# Patient Record
Sex: Male | Born: 1960 | Hispanic: Yes | Marital: Married | Smoking: Former smoker
Health system: Southern US, Community
[De-identification: ages and names within clinical notes are randomized; demographics above are authoritative.]

## PROBLEM LIST (undated history)

## (undated) DIAGNOSIS — E669 Obesity, unspecified: Secondary | ICD-10-CM

## (undated) DIAGNOSIS — F251 Schizoaffective disorder, depressive type: Secondary | ICD-10-CM

## (undated) DIAGNOSIS — E785 Hyperlipidemia, unspecified: Secondary | ICD-10-CM

## (undated) DIAGNOSIS — Z87442 Personal history of urinary calculi: Secondary | ICD-10-CM

## (undated) DIAGNOSIS — Z8619 Personal history of other infectious and parasitic diseases: Secondary | ICD-10-CM

## (undated) DIAGNOSIS — R51 Headache: Secondary | ICD-10-CM

## (undated) DIAGNOSIS — M545 Low back pain, unspecified: Secondary | ICD-10-CM

## (undated) DIAGNOSIS — G8929 Other chronic pain: Secondary | ICD-10-CM

## (undated) DIAGNOSIS — E039 Hypothyroidism, unspecified: Secondary | ICD-10-CM

## (undated) HISTORY — DX: Low back pain: M54.5

## (undated) HISTORY — PX: ABDOMINAL SURGERY: SHX537

## (undated) HISTORY — DX: Obesity, unspecified: E66.9

## (undated) HISTORY — DX: Hypothyroidism, unspecified: E03.9

## (undated) HISTORY — DX: Low back pain, unspecified: M54.50

## (undated) HISTORY — DX: Other chronic pain: G89.29

## (undated) HISTORY — DX: Hyperlipidemia, unspecified: E78.5

## (undated) HISTORY — DX: Personal history of other infectious and parasitic diseases: Z86.19

## (undated) HISTORY — DX: Personal history of urinary calculi: Z87.442

## (undated) HISTORY — DX: Headache: R51

## (undated) HISTORY — DX: Schizoaffective disorder, depressive type: F25.1

---

## 1999-09-27 HISTORY — PX: LUMBAR FUSION: SHX111

## 2009-08-10 LAB — HM COLONOSCOPY: HM Colonoscopy: NORMAL

## 2009-08-26 HISTORY — PX: COLONOSCOPY: SHX174

## 2009-09-01 ENCOUNTER — Encounter: Payer: Self-pay | Admitting: Internal Medicine

## 2010-04-07 ENCOUNTER — Telehealth: Payer: Self-pay | Admitting: Internal Medicine

## 2010-04-07 ENCOUNTER — Ambulatory Visit: Payer: Self-pay | Admitting: Internal Medicine

## 2010-04-07 DIAGNOSIS — G4730 Sleep apnea, unspecified: Secondary | ICD-10-CM | POA: Insufficient documentation

## 2010-04-07 DIAGNOSIS — E039 Hypothyroidism, unspecified: Secondary | ICD-10-CM

## 2010-04-07 DIAGNOSIS — R51 Headache: Secondary | ICD-10-CM

## 2010-04-07 DIAGNOSIS — R519 Headache, unspecified: Secondary | ICD-10-CM | POA: Insufficient documentation

## 2010-04-07 DIAGNOSIS — Z87442 Personal history of urinary calculi: Secondary | ICD-10-CM

## 2010-04-07 DIAGNOSIS — I10 Essential (primary) hypertension: Secondary | ICD-10-CM | POA: Insufficient documentation

## 2010-04-07 DIAGNOSIS — E785 Hyperlipidemia, unspecified: Secondary | ICD-10-CM

## 2010-04-08 ENCOUNTER — Encounter: Payer: Self-pay | Admitting: Internal Medicine

## 2010-04-14 ENCOUNTER — Encounter: Payer: Self-pay | Admitting: Internal Medicine

## 2010-04-14 LAB — CONVERTED CEMR LAB
BUN: 16 mg/dL (ref 6–23)
Bilirubin, Direct: 0.2 mg/dL (ref 0.0–0.3)
CO2: 24 meq/L (ref 19–32)
Chloride: 104 meq/L (ref 96–112)
Eosinophils Absolute: 0.2 10*3/uL (ref 0.0–0.7)
Glucose, Bld: 92 mg/dL (ref 70–99)
HCV Ab: NEGATIVE
Hep A Total Ab: NEGATIVE
Hepatitis B Surface Ag: NEGATIVE
Hgb A1c MFr Bld: 5.3 % (ref <5.7)
Indirect Bilirubin: 0.9 mg/dL (ref 0.0–0.9)
LDL Cholesterol: 142 mg/dL — ABNORMAL HIGH (ref 0–99)
Lymphs Abs: 1.3 10*3/uL (ref 0.7–4.0)
MCV: 85.2 fL (ref 78.0–100.0)
Monocytes Relative: 14 % — ABNORMAL HIGH (ref 3–12)
Neutro Abs: 1.4 10*3/uL — ABNORMAL LOW (ref 1.7–7.7)
Neutrophils Relative %: 42 % — ABNORMAL LOW (ref 43–77)
Potassium: 4.5 meq/L (ref 3.5–5.3)
RBC: 5.08 M/uL (ref 4.22–5.81)
Total Bilirubin: 1.1 mg/dL (ref 0.3–1.2)
VLDL: 27 mg/dL (ref 0–40)
WBC: 3.3 10*3/uL — ABNORMAL LOW (ref 4.0–10.5)

## 2010-04-15 ENCOUNTER — Telehealth: Payer: Self-pay | Admitting: Internal Medicine

## 2010-04-20 ENCOUNTER — Telehealth: Payer: Self-pay | Admitting: Internal Medicine

## 2010-04-27 ENCOUNTER — Encounter: Payer: Self-pay | Admitting: Internal Medicine

## 2010-04-28 ENCOUNTER — Encounter: Payer: Self-pay | Admitting: Internal Medicine

## 2010-05-11 ENCOUNTER — Encounter: Payer: Self-pay | Admitting: Internal Medicine

## 2010-06-03 ENCOUNTER — Ambulatory Visit: Payer: Self-pay | Admitting: Internal Medicine

## 2010-06-03 DIAGNOSIS — R7309 Other abnormal glucose: Secondary | ICD-10-CM

## 2010-06-14 ENCOUNTER — Telehealth: Payer: Self-pay | Admitting: Internal Medicine

## 2010-10-06 ENCOUNTER — Ambulatory Visit
Admission: RE | Admit: 2010-10-06 | Discharge: 2010-10-06 | Payer: Self-pay | Source: Home / Self Care | Attending: Internal Medicine | Admitting: Internal Medicine

## 2010-10-14 LAB — CONVERTED CEMR LAB
BUN: 19 mg/dL (ref 6–23)
CO2: 27 meq/L (ref 19–32)
CRP, High Sensitivity: 4.4 — ABNORMAL HIGH
Calcium: 9.5 mg/dL (ref 8.4–10.5)
Cholesterol: 169 mg/dL (ref 0–200)
Glucose, Bld: 86 mg/dL (ref 70–99)
Hgb A1c MFr Bld: 5.2 % (ref <5.7)
TSH: 0.296 microintl units/mL — ABNORMAL LOW (ref 0.350–4.500)
VLDL: 13 mg/dL (ref 0–40)

## 2010-10-15 ENCOUNTER — Telehealth: Payer: Self-pay | Admitting: Internal Medicine

## 2010-10-15 ENCOUNTER — Encounter: Payer: Self-pay | Admitting: Internal Medicine

## 2010-10-26 NOTE — Medication Information (Signed)
Summary: Prior Authorization Request for Januvia  Prior Authorization Request for Januvia   Imported By: Maryln Gottron 05/04/2010 12:17:27  _____________________________________________________________________  External Attachment:    Type:   Image     Comment:   External Document

## 2010-10-26 NOTE — Progress Notes (Signed)
Summary: FAXED RECORD REQUEST TODR Sherin Quarry   Phone Note Outgoing Call   Call placed by: [facility] Call placed to: dR aLEXANDER sWON  Summary of Call: FAXED REQUEST FOR RECORDS  Initial call taken by: Roselle Locus,  April 07, 2010 11:19 AM     Appended Document: FAXED RECORD REQUEST TODR Sherin Quarry  Records request Faxed to Dr Meda Klinefelter @ [phone]. Office number is [phone]. Records have also been requested from [facility] at [phone]

## 2010-10-26 NOTE — Letter (Signed)
Summary: Records Dated 1-15 thru 11-04-09/Alexander Ernestine Conrad MD  Records Dated 1-15 thru 11-04-09/Alexander Ernestine Conrad MD   Imported By: Lanelle Bal 04/15/2010 08:03:28  _____________________________________________________________________  External Attachment:    Type:   Image     Comment:   External Document

## 2010-10-26 NOTE — Progress Notes (Signed)
Summary: lab results  Phone Note Outgoing Call   Summary of Call: call pt - TSH normal.  continue same dose of thyroid medication.   hep c testing negative.  hep c viral load undetectable Initial call taken by: D. Thomos Lemons DO,  June 14, 2010 12:18 PM  Follow-up for Phone Call        Left detailed message on cell phone re: results and to call if any questions. Nicki Guadalajara Fergerson CMA Duncan Dull)  June 14, 2010 1:46 PM

## 2010-10-26 NOTE — Progress Notes (Signed)
Summary: Januvia Prior Auth--approved  Phone Note Other Incoming   Caller: Fax from [employer] of Call: Spoke to [place] @ [phone] and initiated prior authorization for Januvia. Case # [id number]. She will fax form. Spoke to pt's wife, Harriett Sine and notified her of the P.A. process.  She voices understanding.  Consuella Lose will fax form to [place]. Nicki Guadalajara Fergerson CMA Duncan Dull)  April 20, 2010 2:41 PM   Follow-up for Phone Call        Prior authorization form recieved for Januvia. Awaiting form completion from physician Follow-up by: Glendell Docker CMA,  April 21, 2010 2:40 PM  Additional Follow-up for Phone Call Additional follow up Details #1::        form faxed to [facility]. Awaiting approval status Additional Follow-up by: Glendell Docker CMA,  April 28, 2010 8:26 AM    Additional Follow-up for Phone Call Additional follow up Details #2::    Received authorization from [facility] for Januvia 100mg  (30/30) good until 04/27/2012.  Notified Lakhan at [employer] and pt. Nicki Guadalajara Fergerson CMA Duncan Dull)  April 28, 2010 1:31 PM

## 2010-10-26 NOTE — Letter (Signed)
Summary: Patient No Show/[facility]  Patient No Show/[facility]   Imported By: Lanelle Bal 05/19/2010 09:35:25  _____________________________________________________________________  External Attachment:    Type:   Image     Comment:   External Document

## 2010-10-26 NOTE — Progress Notes (Signed)
Summary: Thyroid  Status  Phone Note Outgoing Call   Summary of Call: call pt - has he been taking his thyroid medication regularly.  does he take with other meds or vitamins? Initial call taken by: D. Thomos Lemons DO,  April 15, 2010 12:57 PM  Follow-up for Phone Call        call placed to patient per Dr Artist Pais instructions. He states that he has not been taking his thyroid medication on  regular basis Follow-up by: Glendell Docker CMA,  April 15, 2010 1:48 PM  Additional Follow-up for Phone Call Additional follow up Details #1::        Inform pt TSH is elevated.  it is important for pt to take thyroid medication daily.   TSH needs to be repeated in 2 months.  Additional Follow-up by: D. Thomos Lemons DO,  April 15, 2010 4:14 PM    Additional Follow-up for Phone Call Additional follow up Details #2::    patient advised per Dr Artist Pais instructions. Future lab order entered for  05/2010 at [facility] Follow-up by: Glendell Docker CMA,  April 15, 2010 4:29 PM

## 2010-10-26 NOTE — Procedures (Signed)
Summary: Colonoscopy/[facility]  Colonoscopy/[facility]   Imported By: Lanelle Bal 04/15/2010 07:57:54  _____________________________________________________________________  External Attachment:    Type:   Image     Comment:   External Document

## 2010-10-26 NOTE — Assessment & Plan Note (Signed)
Summary: TO EST  /NEEDS MEDICATIONS   HBP/ THROID/HES   Vital Signs:  Patient profile:   50 year old male Height:      70 inches Weight:      297.75 pounds BMI:     42.88 O2 Sat:      96 % Temp:     98.1 degrees F oral Pulse rate:   61 / minute Pulse rhythm:   regular Resp:     20 per minute BP sitting:   118 / 74  (right arm) Cuff size:   large  Vitals Entered By: Glendell Docker CMA (April 07, 2010 9:32 AM) CC: Rm 2- New Patient  Is Patient Diabetic? No Comments insurance referral   Primary Care Provider:  D. Thomos Lemons DO  CC:  Rm 2- New Patient .  History of Present Illness: 50 y/o hispanic male to establish his daughter moved [place] due to job at [employer]  he has hx of htn, hyperlipidemia, borderline DM II , and possible hx of chronic hep C  significant wt gain after back surgery - prev weighted 210  gained 90 lbs after back surgery  diagnosed with Hepatitis (Jaundice) in 1996 - question cleared infection     Preventive Screening-Counseling & Management  Alcohol-Tobacco     Alcohol drinks/day: 0     Smoking Status: quit     Packs/Day: <0.25     Year Started: 1979     Year Quit: 1980  Caffeine-Diet-Exercise     Caffeine use/day: 2-3 beverages daily     Does Patient Exercise: yes     Times/week: 7  Allergies (verified): No Known Drug Allergies  Past History:  Past Medical History: Depression / Schizoprenia - last psychiatrist 2010   Wellbutrin, prozac, seroquel depression worse after back surgery Headache Hepatitis  Hyperlipidemia Hypertension Hypothyroidism Nephrolithiasis, hx of DM II - borderline  Past Surgical History: Inguinal herniorrhaphy Lumbar fusion  Family History: Family History Depression Family History High cholesterol Family History Hypertension Family History of Stroke F 1st degree relative <60   Social History: Married 25 years 2 daughters 21,24 Disabled due to back surgery Previously worked in Naval architect - Producer, television/film/video [place]:  [place] Packs/Day:  <0.25 Caffeine use/day:  2-3 beverages daily Does Patient Exercise:  yes  Review of Systems  The patient denies chest pain, syncope, prolonged cough, hemoptysis, abdominal pain, melena, hematochezia, severe indigestion/heartburn, and depression.         he has been dieting and exercising.  he is encouraged by wt loss   Physical Exam  General:  alert and overweight-appearing.   Head:  normocephalic and atraumatic.   Eyes:  pupils equal, pupils round, and pupils reactive to light.   Ears:  R ear normal and L ear normal.   Mouth:  pharynx pink and moist.   Neck:  No deformities, masses, or tenderness noted.no carotid bruits.   Lungs:  normal respiratory effort, normal breath sounds, no crackles, and no wheezes.   Heart:  normal rate, regular rhythm, and no gallop.   Abdomen:  soft, non-tender, and normal bowel sounds.   Extremities:  trace left pedal edema and trace right pedal edema.   Neurologic:  cranial nerves II-XII intact and gait normal.   Psych:  normally interactive, good eye contact, not anxious appearing, and not depressed appearing.     Impression & Recommendations:  Problem # 1:  HEPATITIS C (ICD-070.51) Pt with questionable  hx of Hep C.  he states he cleared infection.  reassess obtain copy of med records  Future Orders: T-CBC w/Diff [id number]) ... 04/12/2010 T-Hepatitis B Surface Antibody [phone]) ... 04/12/2010 T-Hepatitis B Surface Antigen [id number]) ... 04/12/2010 T-Hepatitis C Antibody ([id number]) ... 04/12/2010 T-Hepatitis A Antibody ([id number]) ... 04/12/2010  Problem # 2:  SNORING (ICD-786.09) pt with hx of loud snoring.  arrange sleep study Orders: Sleep Disorder Referral (Sleep Disorder)  Problem # 3:  HYPERTENSION (ICD-401.9)  His updated medication list for this problem includes:    Diovan 160 Mg Tabs (Valsartan) .Marland Kitchen... Take 1 tablet by mouth  two times a day  Future Orders: T-Basic Metabolic Panel [id number]) ... 04/12/2010  BP today: 118/74  Problem # 4:  HYPERLIPIDEMIA (ICD-272.4)  His updated medication list for this problem includes:    Crestor 40 Mg Tabs (Rosuvastatin calcium) .Marland Kitchen... Take 1 tab by mouth at bedtime  Future Orders: T-Hepatic Function [id number]) ... 04/12/2010 T-Lipid Profile [id number]) ... 04/12/2010  Problem # 5:  HYPOTHYROIDISM (ICD-244.9) monitor TFTs His updated medication list for this problem includes:    Synthroid 150 Mcg Tabs (Levothyroxine sodium) .Marland Kitchen... Take 1 tablet by mouth every morning  Future Orders: T-TSH ([id number]) ... 04/12/2010  Complete Medication List: 1)  Januvia 100 Mg Tabs (Sitagliptin phosphate) .... Take 1 tablet by mouth once a day 2)  Plavix 75 Mg Tabs (Clopidogrel bisulfate) .... Take 1 tablet by mouth once a day 3)  Diovan 160 Mg Tabs (Valsartan) .... Take 1 tablet by mouth two times a day 4)  Synthroid 150 Mcg Tabs (Levothyroxine sodium) .... Take 1 tablet by mouth every morning 5)  Calcitriol 0.5 Mcg Caps (Calcitriol) .... Take 1 capsule by mouth two times a day 6)  Crestor 40 Mg Tabs (Rosuvastatin calcium) .... Take 1 tab by mouth at bedtime 7)  Multivitamins Tabs (Multiple vitamin) .... Take 1 tablet by mouth once a day  Other Orders: Future Orders: T- Hemoglobin A1C ([id number]) ... 04/12/2010  Patient Instructions: 1)  Please schedule a follow-up appointment in 1 month. 2)  BMP prior to visit, ICD-9: 401.9 3)  Hepatic Panel prior to visit, ICD-9: 272.4 4)  Lipid Panel prior to visit, ICD-9: 272.4  5)  TSH prior to visit, ICD-9: 272.4 6)  CBC w/ Diff prior to visit, ICD-9: 070.51 7)  HbgA1C prior to visit, ICD-9: 790.29 8)  Hep B antibody,  Hep B antigen,  Hep C antibody,  Hep A antibody:  070.51 9)  Please return for lab work within 1 week. Prescriptions: CALCITRIOL 0.5 MCG CAPS (CALCITRIOL) Take 1 capsule by mouth two times a day  #30 x 1    Entered and Authorized by:   D. Thomos Lemons DO   Signed by:   D. Thomos Lemons DO on 04/07/2010   Method used:   Electronically to        [facility] [phone]* (retail)       [address]       [place], [place]  [address]       Ph: [phone]       Fax: [phone]   RxID:   [id number] SYNTHROID 150 MCG TABS (LEVOTHYROXINE SODIUM) Take 1 tablet by mouth every morning  #30 x 1   Entered and Authorized by:   D. Thomos Lemons DO   Signed by:   D. Thomos Lemons DO on 04/07/2010   Method used:   Electronically to  [facility] [phone]* (retail)       [address]       [place], [place]  [address]       Ph: [phone]       Fax: [phone]   RxID:   [id number] CRESTOR 40 MG TABS (ROSUVASTATIN CALCIUM) Take 1 tab by mouth at bedtime  #30 x 1   Entered and Authorized by:   D. Thomos Lemons DO   Signed by:   D. Thomos Lemons DO on 04/07/2010   Method used:   Electronically to        [facility] [phone]* (retail)       [address]       [place], [place]  [address]       Ph: [phone]       Fax: [phone]   RxID:   [id number] DIOVAN 160 MG TABS (VALSARTAN) Take 1 tablet by mouth two times a day  #60 x 1   Entered and Authorized by:   D. Thomos Lemons DO   Signed by:   D. Thomos Lemons DO on 04/07/2010   Method used:   Electronically to        [facility] [phone]* (retail)       [address]       [place], [place]  [address]       Ph: [phone]       Fax: [phone]   RxID:   [id number] PLAVIX 75 MG TABS (CLOPIDOGREL BISULFATE) Take 1 tablet by mouth once a day  #30 x 1   Entered and Authorized by:   D. Thomos Lemons DO   Signed by:   D. Thomos Lemons DO on 04/07/2010   Method used:   Electronically to        [facility] [phone]* (retail)       [address]       [place], [place]  [address]        Ph: [phone]       Fax: [phone]   RxID:   [id number] JANUVIA 100 MG TABS (SITAGLIPTIN PHOSPHATE) Take 1 tablet by mouth once a day  #30 x 1   Entered and Authorized by:   D. Thomos Lemons DO   Signed by:   D. Thomos Lemons DO on 04/07/2010   Method used:   Electronically to        [facility] [phone]* (retail)       [address]       [place], [place]  [address]       Ph: [phone]       Fax: [phone]   RxID:   [id number]    Preventive Care Screening  Colonoscopy:    Date:  08/10/2009    Results:  normal    Preventive Care Screening  Colonoscopy:    Date:  08/10/2009    Results:  normal    Current Allergies (reviewed today): No known allergies

## 2010-10-26 NOTE — Medication Information (Signed)
Summary: Approval for Januvia/[employer]  Approval for Januvia/[employer]   Imported By: Lanelle Bal 05/21/2010 11:01:34  _____________________________________________________________________  External Attachment:    Type:   Image     Comment:   External Document

## 2010-10-26 NOTE — Assessment & Plan Note (Signed)
Summary: 1 month follow up/mhf rsch from bmp list/dt   Vital Signs:  Patient profile:   50 year old male Height:      70 inches Weight:      274.25 pounds BMI:     39.49 O2 Sat:      97 % on Room air Temp:     97.9 degrees F oral Pulse rate:   61 / minute Pulse rhythm:   regular Resp:     18 per minute BP sitting:   110 / 80  (right arm) Cuff size:   large  Vitals Entered By: Glendell Docker CMA (June 03, 2010 7:54 AM)  O2 Flow:  Room air  Contraindications/Deferment of Procedures/Staging:    Test/Procedure: FLU VAX    Reason for deferment: patient declined  CC: 1 month follow up  Is Patient Diabetic? No Pain Assessment Patient in pain? no      Comments no concerns   Primary Care Provider:  DThomos Lemons DO  CC:  1 month follow up .  History of Present Illness: 50 y/o hispanic male for f/u pt doing very well he is exercising regularly at the gym and following low cal diet he lost additional 25-30 lbs since prev visit he stopped taking all meds with exception of thyroid medication  he denies chest pain or SOB with exercise    Preventive Screening-Counseling & Management  Alcohol-Tobacco     Smoking Status: quit  Allergies (verified): No Known Drug Allergies  Past History:  Past Medical History: Depression / Schizoprenia - last psychiatrist 2010   Wellbutrin, prozac, seroquel depression worse after back surgery Headache Hepatitis C Hyperlipidemia Hypertension Hypothyroidism Nephrolithiasis, hx of DM II - borderline  Past Surgical History: Inguinal herniorrhaphy Lumbar fusion    Family History: Family History Depression Family History High cholesterol Family History Hypertension Family History of Stroke F 1st degree relative <60     Social History: Married 25 years 2 daughters 21,24 Disabled due to back surgery 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   Head:  normocephalic and atraumatic.   Lungs:  normal respiratory effort, normal breath sounds, no crackles, and no wheezes.   Heart:  normal rate, regular rhythm, and no gallop.   Extremities:  trace left pedal edema and trace right pedal edema.   Psych:  normally interactive, good eye contact, not anxious appearing, and not depressed appearing.     Impression & Recommendations:  Problem # 1:  HYPOTHYROIDISM (ICD-244.9) repeat TSH today.  pt reports missing 20 days of thyroid medication while in the process of moving to [place]  His updated medication list for this problem includes:    Synthroid 150 Mcg Tabs (Levothyroxine sodium) .Marland Kitchen... Take 1 tablet by mouth every morning  Orders: T-TSH ([id number])  Labs Reviewed: TSH: 18.415 (04/14/2010)    HgBA1c: 5.3 (04/14/2010) Chol: 202 (04/14/2010)   HDL: 33 (04/14/2010)   LDL: 142 (04/14/2010)   TG: 134 (04/14/2010)  Problem # 2:  HEPATITIS C (ICD-070.51) Hep C antibody negative.  LFTs normal. Pt reports prev diagnosis of chronic Hep C.  check viral load Orders: T- * Misc. Laboratory test [phone])  Problem # 3:  HYPERLIPIDEMIA (ICD-272.4) pt stopped taking crestor The following medications were removed from the medication list:    Crestor 40 Mg Tabs (Rosuvastatin calcium) .Marland Kitchen... Take 1 tab by mouth at bedtime  Problem #  4:  HYPERTENSION (ICD-401.9) BP normal w/o diovan after significant wt loss. continue to monitor  The following medications were removed from the medication list:    Diovan 160 Mg Tabs (Valsartan) .Marland Kitchen... Take 1 tablet by mouth two times a day  BP today: 110/80 Prior BP: 118/74 (04/07/2010)  Labs Reviewed: K+: 4.5 (04/14/2010) Creat: : 1.33 (04/14/2010)   Chol: 202 (04/14/2010)   HDL: 33 (04/14/2010)   LDL: 142 (04/14/2010)   TG: 134 (04/14/2010)  Problem # 5:  DIABETES MELLITUS, TYPE II, BORDERLINE (ICD-790.29) Assessment: Improved recent A1c 5.3.  Pt very  motivated re:  lifestyle changes.  continue diet and exercise  The following medications were removed from the medication list:    Januvia 100 Mg Tabs (Sitagliptin phosphate) .Marland Kitchen... Take 1 tablet by mouth once a day  Labs Reviewed: Creat: 1.33 (04/14/2010)     Complete Medication List: 1)  Synthroid 150 Mcg Tabs (Levothyroxine sodium) .... Take 1 tablet by mouth every morning 2)  Multivitamins Tabs (Multiple vitamin) .... Take 1 tablet by mouth once a day  Patient Instructions: 1)  Please schedule a follow-up appointment in 4 months.  Current Allergies (reviewed today): No known allergies    Immunization History:  Influenza Immunization History:    Influenza:  declined (06/03/2010)

## 2010-10-28 NOTE — Progress Notes (Signed)
Summary: Lab Results  Phone Note Outgoing Call   Summary of Call: call pt - blood work shows pt taking too much thyroid hormone.  I sugget with decrease dose.  see rx   repeat TSH in 2 months Initial call taken by: D. Thomos Lemons DO,  October 15, 2010 9:06 AM  Follow-up for Phone Call        call placed to patient at [phone], he has been informed per Dr Artist Pais instructions. Lab order entered for [facility] for March 2012 Follow-up by: Glendell Docker CMA,  October 15, 2010 9:26 AM    New/Updated Medications: SYNTHROID 137 MCG TABS (LEVOTHYROXINE SODIUM) one by mouth once daily Prescriptions: SYNTHROID 137 MCG TABS (LEVOTHYROXINE SODIUM) one by mouth once daily  #30 x 2   Entered and Authorized by:   D. Thomos Lemons DO   Signed by:   D. Thomos Lemons DO on 10/15/2010   Method used:   Electronically to        [facility] [phone]* (retail)       [address]       [place], [place]  [address]       Ph: [phone]       Fax: [phone]   RxID:   [id number]

## 2010-10-28 NOTE — Letter (Signed)
   [place] at [facility] [address] [place], [place]  [address]  [place] Phone: [phone]      October 15, 2010   Franklin Shannon [address]  [place], [place] [address]  RE:  LAB RESULTS  Dear  Franklin Shannon,  The following is an interpretation of your most recent lab tests.  Please take note of any instructions provided or changes to medications that have resulted from your lab work.  ELECTROLYTES:  Good - no changes needed  KIDNEY FUNCTION TESTS:  Good - no changes needed  LIPID PANEL:  Good - no changes needed Triglyceride: 67   Cholesterol: 169   LDL: 114   HDL: 42   Chol/HDL%:  4.0 Ratio   DIABETIC STUDIES:  Excellent - no changes needed Blood Glucose: 86   HgbA1C: 5.2          Sincerely Yours,    Dr. Thomos Lemons  Appended Document:  Mailed.

## 2010-10-28 NOTE — Assessment & Plan Note (Signed)
Summary: 4 MONTH FOLLOW UP/MHF   Vital Signs:  Patient profile:   50 year old male Height:      70 inches Weight:      245.25 pounds BMI:     35.32 O2 Sat:      100 % on Room air Temp:     97.7 degrees F oral Pulse rate:   64 / minute Resp:     18 per minute BP sitting:   106 / 72  (right arm) Cuff size:   large  Vitals Entered By: Glendell Docker CMA (October 06, 2010 8:08 AM)  O2 Flow:  Room air CC: 4 Month Follow up Is Patient Diabetic? No Pain Assessment Patient in pain? no        Primary Care Provider:  Dondra Spry DO  CC:  4 Month Follow up.  History of Present Illness: 50 y/o male for f/u making good progress with weight loss initially wt loss easier - now losing 1-2 lbs per week pt following cal restricted diet and exercising regularly  still getting occ headaches     Preventive Screening-Counseling & Management  Alcohol-Tobacco     Smoking Status: quit  Allergies (verified): No Known Drug Allergies  Past History:  Past Medical History: Depression / Schizoprenia - last psychiatrist 2010   Wellbutrin, prozac, seroquel depression worse after back surgery Headache Hyperlipidemia Hypertension Hypothyroidism Nephrolithiasis, hx of Obesity / DM II - borderline  Past Surgical History: Inguinal herniorrhaphy Lumbar fusion     Social History: Married 25 years 2 daughters 21,24 Disabled due to back surgery 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   Head:  normocephalic and atraumatic.   Mouth:  pharynx pink and moist.   Lungs:  normal respiratory effort, normal breath sounds, no crackles, and no wheezes.   Heart:  normal rate, regular rhythm, and no gallop.   Extremities:  trace left pedal edema and trace right pedal edema.   Psych:  normally interactive, good eye contact, not anxious appearing, and not depressed  appearing.     Impression & Recommendations:  Problem # 1:  HYPOTHYROIDISM (ICD-244.9) monitor TFTs  His updated medication list for this problem includes:    Synthroid 150 Mcg Tabs (Levothyroxine sodium) .Marland Kitchen... Take 1 tablet by mouth every morning  Orders: T-TSH ([id number])  Problem # 2:  HYPERTENSION (ICD-401.9) stable off meds.  continue to monitor  Orders: T-Basic Metabolic Panel [id number])  BP today: 106/72 Prior BP: 110/80 (06/03/2010)  Labs Reviewed: K+: 4.5 (04/14/2010) Creat: : 1.33 (04/14/2010)   Chol: 202 (04/14/2010)   HDL: 33 (04/14/2010)   LDL: 142 (04/14/2010)   TG: 134 (04/14/2010)  Problem # 3:  HYPERLIPIDEMIA (ICD-272.4) pt is not fasting today  Orders: T-Lipid Profile ([id number]) CRP, high sensitivity-[facility] ([id number])  Problem # 4:  HEADACHE (ICD-784.0) pt gets periodic headaches wife notes symptoms brought on by stress ( arguing with his neighbor) pt reports prev w/u including MRI of brain negative continue use ibuprofen as needed.  use short term Patient advised to follow up if headache pattern or severity changes  Complete Medication List: 1)  Synthroid 150 Mcg Tabs (Levothyroxine sodium) .... Take 1 tablet by mouth every morning  Other Orders: T- Hemoglobin A1C ([id number])  Patient Instructions: 1)  Please schedule a follow-up appointment in 6 months.   Orders Added: 1)  T-TSH [[id number]] 2)  T- Hemoglobin  A1C [[id number]] 3)  T-Basic Metabolic Panel [[id number]] 4)  T-Lipid Profile [[id number]] 5)  CRP, high sensitivity-[facility] [[id number]] 6)  Est. Patient Level III [[address]]    Current Allergies (reviewed today): No known allergies

## 2010-12-10 ENCOUNTER — Ambulatory Visit (INDEPENDENT_AMBULATORY_CARE_PROVIDER_SITE_OTHER): Payer: Medicare HMO | Admitting: Internal Medicine

## 2010-12-10 ENCOUNTER — Encounter: Payer: Self-pay | Admitting: Internal Medicine

## 2010-12-10 DIAGNOSIS — R7309 Other abnormal glucose: Secondary | ICD-10-CM

## 2010-12-10 DIAGNOSIS — I10 Essential (primary) hypertension: Secondary | ICD-10-CM

## 2010-12-28 NOTE — Assessment & Plan Note (Signed)
Summary: FOLLOW UP/HEA   Vital Signs:  Patient profile:   50 year old male Height:      70 inches Weight:      234 pounds BMI:     33.70 O2 Sat:      99 % on Room air Temp:     97.4 degrees F oral Pulse rate:   59 / minute Resp:     18 per minute BP sitting:   104 / 70  (right arm) Cuff size:   large  Vitals Entered By: Glendell Docker CMA (December 10, 2010 2:04 PM)  O2 Flow:  Room air CC: follow-up visit, Type 2 diabetes mellitus follow-up Is Patient Diabetic? No Pain Assessment Patient in pain? no      Comments no concerns, refill on synthroid   Primary Care Provider:  Dondra Spry DO  CC:  follow-up visit and Type 2 diabetes mellitus follow-up.  History of Present Illness:  Borderline Type 2 Diabetes Mellitus Follow-Up      This is a 50 year old man who presents for Type 2 diabetes mellitus follow-up.  The patient denies weight gain.  The patient denies the following symptoms: chest pain.  Since the last visit the patient reports good dietary compliance and exercising regularly.  pt continues to lose wt but at slower rate  hypothyroidism - stable  Preventive Screening-Counseling & Management  Alcohol-Tobacco     Smoking Status: quit  Allergies (verified): No Known Drug Allergies  Past History:  Past Medical History: Depression / Schizoprenia - last psychiatrist 2010   Wellbutrin, prozac, seroquel depression worse after back surgery Headache Hyperlipidemia Hypertension Hypothyroidism  Nephrolithiasis, hx of Obesity / DM II - borderline  Family History: Family History Depression Family History High cholesterol Family History Hypertension Family History of Stroke F 1st degree relative <60      Social History: Married 25 years 2 daughters 21,24 Disabled due to back surgery 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     Neck:  No deformities, masses, or tenderness noted.no carotid bruits.   Lungs:  normal respiratory effort, normal breath sounds, no crackles, and no wheezes.   Heart:  normal rate, regular rhythm, and no gallop.   Extremities:  trace left pedal edema and trace right pedal edema.     Impression & Recommendations:  Problem # 1:  DIABETES MELLITUS, TYPE II, BORDERLINE (ICD-790.29) Assessment Improved  Labs Reviewed: Creat: 1.13 (10/14/2010)     Problem # 2:  HYPOTHYROIDISM (ICD-244.9) Assessment: Unchanged slightly overtreated.  reduce dose to 125 mcg His updated medication list for this problem includes:    Levothyroxine Sodium 125 Mcg Tabs (Levothyroxine sodium) ..... One by mouth once daily  Labs Reviewed: TSH: 0.312 (12/08/2010)    HgBA1c: 5.2 (10/14/2010) Chol: 169 (10/14/2010)   HDL: 42 (10/14/2010)   LDL: 114 (10/14/2010)   TG: 67 (10/14/2010)  Complete Medication List: 1)  Levothyroxine Sodium 125 Mcg Tabs (Levothyroxine sodium) .... One by mouth once daily  Patient Instructions: 1)  Please schedule a follow-up appointment in 6 months (30 MINS) 2)  TSH prior to visit, ICD-9:  244.9 - 2 months Prescriptions: LEVOTHYROXINE SODIUM 125 MCG TABS (LEVOTHYROXINE SODIUM) one by mouth once daily  #90 x 1   Entered and Authorized by:   D. Thomos Lemons DO   Signed by:   D. Thomos Lemons DO on  12/10/2010   Method used:   Electronically to        [facility]. [phone]* (retail)       [address]       [place], [place]  [address]       Ph: [phone]       Fax: [phone]   RxID:   [id number]    Orders Added: 1)  Est. Patient Level III [44010]      Current Allergies (reviewed today): No known allergies

## 2011-02-08 ENCOUNTER — Encounter: Payer: Self-pay | Admitting: Internal Medicine

## 2011-02-11 ENCOUNTER — Encounter: Payer: Self-pay | Admitting: Internal Medicine

## 2011-02-11 ENCOUNTER — Ambulatory Visit (INDEPENDENT_AMBULATORY_CARE_PROVIDER_SITE_OTHER): Payer: Medicare HMO | Admitting: Internal Medicine

## 2011-02-11 VITALS — BP 100/70 | HR 64 | Temp 97.8°F | Resp 18 | Ht 70.0 in | Wt 218.0 lb

## 2011-02-11 DIAGNOSIS — Z0289 Encounter for other administrative examinations: Secondary | ICD-10-CM

## 2011-03-09 ENCOUNTER — Ambulatory Visit: Payer: Self-pay | Admitting: Internal Medicine

## 2011-04-23 NOTE — Progress Notes (Signed)
  Subjective:    Patient ID: Franklin Shannon, male    DOB: 1961-02-17, 50 y.o.   MRN: [id number]  HPI    Review of Systems     Objective:   Physical Exam        Assessment & Plan:   No problem-specific assessment & plan notes found for this encounter.

## 2011-06-14 ENCOUNTER — Other Ambulatory Visit: Payer: Self-pay | Admitting: *Deleted

## 2011-06-14 MED ORDER — LEVOTHYROXINE SODIUM 125 MCG PO CAPS: 125.0000 ug | ORAL_CAPSULE | Freq: Every day | ORAL | Status: DC

## 2011-06-14 NOTE — Telephone Encounter (Signed)
Call received from pharmacy for refill on Levothyroxine 125 mcg. Per pharmacy message, the patient is requesting a 90 day supply

## 2011-06-14 NOTE — Telephone Encounter (Signed)
rx sent in electronically 

## 2011-09-27 DIAGNOSIS — Z87442 Personal history of urinary calculi: Secondary | ICD-10-CM

## 2011-09-27 HISTORY — DX: Personal history of urinary calculi: Z87.442

## 2012-10-04 ENCOUNTER — Ambulatory Visit (INDEPENDENT_AMBULATORY_CARE_PROVIDER_SITE_OTHER): Payer: Medicare Other | Admitting: Family Medicine

## 2012-10-04 ENCOUNTER — Encounter: Payer: Self-pay | Admitting: Family Medicine

## 2012-10-04 VITALS — BP 122/86 | HR 52 | Temp 98.5°F | Ht 68.5 in | Wt 251.8 lb

## 2012-10-04 DIAGNOSIS — J069 Acute upper respiratory infection, unspecified: Secondary | ICD-10-CM

## 2012-10-04 DIAGNOSIS — I10 Essential (primary) hypertension: Secondary | ICD-10-CM

## 2012-10-04 DIAGNOSIS — G8929 Other chronic pain: Secondary | ICD-10-CM | POA: Insufficient documentation

## 2012-10-04 DIAGNOSIS — R0989 Other specified symptoms and signs involving the circulatory and respiratory systems: Secondary | ICD-10-CM

## 2012-10-04 DIAGNOSIS — M545 Low back pain, unspecified: Secondary | ICD-10-CM

## 2012-10-04 DIAGNOSIS — Z87442 Personal history of urinary calculi: Secondary | ICD-10-CM

## 2012-10-04 DIAGNOSIS — R0609 Other forms of dyspnea: Secondary | ICD-10-CM

## 2012-10-04 DIAGNOSIS — R51 Headache: Secondary | ICD-10-CM

## 2012-10-04 DIAGNOSIS — E039 Hypothyroidism, unspecified: Secondary | ICD-10-CM

## 2012-10-04 DIAGNOSIS — Z125 Encounter for screening for malignant neoplasm of prostate: Secondary | ICD-10-CM

## 2012-10-04 DIAGNOSIS — Z23 Encounter for immunization: Secondary | ICD-10-CM

## 2012-10-04 DIAGNOSIS — E785 Hyperlipidemia, unspecified: Secondary | ICD-10-CM

## 2012-10-04 DIAGNOSIS — R7309 Other abnormal glucose: Secondary | ICD-10-CM

## 2012-10-04 DIAGNOSIS — Z8619 Personal history of other infectious and parasitic diseases: Secondary | ICD-10-CM

## 2012-10-04 MED ORDER — ALBUTEROL SULFATE HFA 108 (90 BASE) MCG/ACT IN AERS: 2.0000 | INHALATION_SPRAY | Freq: Four times a day (QID) | RESPIRATORY_TRACT | Status: DC | PRN

## 2012-10-04 MED ORDER — SYNTHROID 125 MCG PO TABS: 125.0000 ug | ORAL_TABLET | Freq: Every day | ORAL | Status: DC

## 2012-10-04 MED ORDER — LEVOTHYROXINE SODIUM 125 MCG PO CAPS: 125.0000 ug | ORAL_CAPSULE | Freq: Every day | ORAL | Status: DC

## 2012-10-04 NOTE — Patient Instructions (Addendum)
empieze synthroid diarios.  regrese en 4-6 semanas para revisar sangre y ahi podremos hacer calibracion si se necesita. regresar en 4-6 semanas para laboratorio de Publishing rights manager. regrese 1 semana despues para examen fisico. vaccuna contra flu hoy. creo que tiene infeccion viral que esta causando reactividad [employer] - use albuterol para ayudar a respirar profundo - si tiene mas fiebre, tos que [place] en vez de [place], o algun otra preocupacion, dejenos saber. Gusto conocerlo hoy, llame a clinica con preguntas.

## 2012-10-04 NOTE — Assessment & Plan Note (Signed)
Stable off meds. Continue to monitor.  May be able to take of problem list.

## 2012-10-04 NOTE — Progress Notes (Signed)
Subjective:    Patient ID: Franklin Shannon, male    DOB: 1961-06-20, 52 y.o.   MRN: [id number]  HPI CC: transfer of care  Prior pt of Dr. Artist Pais, would like to [facility] at [facility].  Moved from [place] 2011.  Cold sxs - 8-9 d h/o night time cough, chest congestion, ST, fever, HA  Has taken vit C so far, robitussin. Wife and daughter also sick. No h/o asthma.  No smokers at home.  Chronic constipation - since thyroid disease.  Hypothyroid - dx 1998.  Has been on synthroid long term.  Borderline diabetes - trying to exercise, lose weight.  Goes to gym daily.  Eats healthy - fruits/vegetables daily.  However has gained 33 lbs in last 1.5 yrs.  Doesn't check sugars. Lab Results  Component Value Date   HGBA1C 5.2 10/14/2010   Preventative:  No recent CPE Flu shot -  Tetanus - thinks 20+ yrs ago. Colonoscopy - 2010.  H/o blood in stool, told normal study. Prostate screening - no h/o screening.  H/o prostatitis in past.  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  Past histories reviewed and updated if relevant as below. Patient Active Problem List  Diagnosis  . HYPOTHYROIDISM  . HYPERLIPIDEMIA  . HYPERTENSION  . HEADACHE  . SNORING  . DIABETES MELLITUS, TYPE II, BORDERLINE  . NEPHROLITHIASIS, HX OF   Past Medical History  Diagnosis Date  . Schizoaffective disorder, depressive type     last psychiatrist 2010- worsened after back surgery, Wellbutrin, Prozac, seroquel  . Headache   . Hyperlipidemia   . Hypertension   . Hypothyroidism   . History of nephrolithiasis 2013  . Obesity   . Borderline diabetes     Type II, ?prediabetes  . Chronic lower back pain   . History of chicken  pox   . Hepatitis C     resolved   Past Surgical History  Procedure Date  . Lumbar fusion 2001    with rods  . Abdominal surgery     hernia?   History  Substance Use Topics  . Smoking status: Former Games developer  . Smokeless tobacco: Never Used     Comment: quit in 1980's  . Alcohol Use: No   Family History  Problem Relation Age of Onset  . Diabetes Daughter   . Diabetes Brother   . Cancer Father     throat  . Cancer Paternal Grandfather     throat  . Cancer Paternal Aunt     stomach x2  . Cancer Brother     eye  . Hyperlipidemia Mother   . Hypertension Mother   . CAD Mother     MI x2   No Known Allergies Current Outpatient Prescriptions on File Prior to Visit  Medication Sig Dispense Refill  . Levothyroxine Sodium 125 MCG CAPS Take 1 capsule (125 mcg total) by mouth daily.  30 capsule  3     Review of Systems No vision changes, abd pain, nausea/vomiting, diarrhea, chest pain, SOB.    Objective:   Physical Exam  Nursing note and vitals reviewed. Constitutional: He is oriented to person, place, and time. He appears well-developed and well-nourished. No distress.  HENT:  Head: Normocephalic and atraumatic.  Right Ear: Hearing, tympanic membrane, external ear and ear canal normal.  Left Ear: Hearing, tympanic membrane, external ear and ear canal normal.  Nose: Nose normal. No mucosal edema or rhinorrhea. Right sinus exhibits no maxillary sinus tenderness and no frontal sinus tenderness. Left sinus exhibits no maxillary sinus tenderness and no frontal sinus tenderness.  Mouth/Throat: Uvula is midline, oropharynx is clear and moist and mucous membranes are normal. No oropharyngeal exudate, posterior oropharyngeal edema, posterior oropharyngeal erythema or tonsillar abscesses.  Eyes: Conjunctivae normal and EOM are normal. Pupils are equal, round, and reactive to light. No scleral icterus.  Neck: Normal range of motion. Neck supple. Carotid bruit is not present. No  thyromegaly present.  Cardiovascular: Normal rate, regular rhythm, normal heart sounds and intact distal pulses.   No murmur heard. Pulses:      Radial pulses are 2+ on the right side, and 2+ on the left side.  Pulmonary/Chest: Effort normal and breath sounds normal. No respiratory distress. He has no wheezes. He has no rales.       Slight R rhonchi/wheeze  Abdominal: Soft. Bowel sounds are normal. He exhibits no distension and no mass. There is no tenderness. There is no rebound and no guarding.  Musculoskeletal: Normal range of motion. He exhibits no edema.  Lymphadenopathy:    He has no cervical adenopathy.  Neurological: He is alert and oriented to person, place, and time.       CN grossly intact, station and gait intact  Skin: Skin is warm and dry. No rash noted.  Psychiatric: He has a normal mood and affect. His behavior is normal. Judgment and thought content normal.       Assessment & Plan:

## 2012-10-04 NOTE — Assessment & Plan Note (Signed)
Has not taken consistently for last 1.5 yrs.  Anticipate some of his current sxs stemming from this. restart synthroid daily = sent to pharmacy and will recheck in 4-6 wks. Discussed brand vs generic with pt - desires brand name only.

## 2012-10-04 NOTE — Assessment & Plan Note (Signed)
unclear dx as last A1c 5.2.  Recheck when returns for blood work.

## 2012-10-04 NOTE — Assessment & Plan Note (Signed)
On disability for this.  ?

## 2012-10-04 NOTE — Assessment & Plan Note (Signed)
Discussed anticipated course of viral illness. Some reactivity on exam - wheeze/rhonchi on R lower lobe. Treat with albuterol - discussed common side effects of B-blockers. Pt states robitussin helping him sleep. Discussed red flags to return with concern for bacterial superinfection.

## 2012-10-04 NOTE — Assessment & Plan Note (Signed)
Check when returns fasting. 

## 2012-10-05 ENCOUNTER — Telehealth: Payer: Self-pay

## 2012-10-05 NOTE — Telephone Encounter (Signed)
Message left advising pharmacy to use name brand synthroid 125 mcg.

## 2012-10-05 NOTE — Telephone Encounter (Signed)
I want name brand synthroid daily.  Thanks.

## 2012-10-05 NOTE — Telephone Encounter (Signed)
[facility] called to clarify received synthroid name brand 125 mcg prescription and also received Tirosint 125 mcg but in capsule form. Which do you want pt to have.

## 2012-11-28 ENCOUNTER — Other Ambulatory Visit (INDEPENDENT_AMBULATORY_CARE_PROVIDER_SITE_OTHER): Payer: Medicare Other

## 2012-11-28 DIAGNOSIS — Z125 Encounter for screening for malignant neoplasm of prostate: Secondary | ICD-10-CM

## 2012-11-28 LAB — COMPREHENSIVE METABOLIC PANEL
ALT: 28 U/L (ref 0–53)
AST: 25 U/L (ref 0–37)
Albumin: 4 g/dL (ref 3.5–5.2)
Alkaline Phosphatase: 53 U/L (ref 39–117)
BUN: 21 mg/dL (ref 6–23)
Calcium: 9.9 mg/dL (ref 8.4–10.5)
Chloride: 105 mEq/L (ref 96–112)
Potassium: 5.2 mEq/L — ABNORMAL HIGH (ref 3.5–5.1)
Sodium: 138 mEq/L (ref 135–145)
Total Protein: 7.4 g/dL (ref 6.0–8.3)

## 2012-11-28 LAB — LIPID PANEL
Cholesterol: 196 mg/dL (ref 0–200)
LDL Cholesterol: 138 mg/dL — ABNORMAL HIGH (ref 0–99)
Total CHOL/HDL Ratio: 5
VLDL: 19.4 mg/dL (ref 0.0–40.0)

## 2012-12-03 ENCOUNTER — Encounter: Payer: Self-pay | Admitting: Family Medicine

## 2012-12-03 ENCOUNTER — Ambulatory Visit (INDEPENDENT_AMBULATORY_CARE_PROVIDER_SITE_OTHER): Payer: Medicare Other | Admitting: Family Medicine

## 2012-12-03 VITALS — BP 118/76 | HR 76 | Temp 98.1°F | Ht 70.0 in | Wt 256.0 lb

## 2012-12-03 DIAGNOSIS — E785 Hyperlipidemia, unspecified: Secondary | ICD-10-CM

## 2012-12-03 MED ORDER — SYNTHROID 125 MCG PO TABS: 125.0000 ug | ORAL_TABLET | Freq: Every day | ORAL | Status: DC

## 2012-12-03 NOTE — Assessment & Plan Note (Signed)
No evidence of this - removed from list.

## 2012-12-03 NOTE — Assessment & Plan Note (Signed)
Mild, off meds. 

## 2012-12-03 NOTE — Assessment & Plan Note (Signed)
Chronic. Stable on current dose - continue.

## 2012-12-03 NOTE — Progress Notes (Signed)
Subjective:    Patient ID: Franklin Shannon, male    DOB: [birth year], 52 y.o.   MRN: [id number]  HPI CC: CPE  Here for annual exam.  No concerns today. No recent vision exam - rec schedule this.  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  moved here from [place], [place]  Activity: gym daily - cardio Diet: good water, fruits/vegetables dialy  Preventative:  Flu shot - done last visit. 2014. Tetanus - Tdap today  Colonoscopy - 2010. H/o blood in stool.  1 polyp - no results of pathology available.  Requested today. Prostate screening - no h/o screening. H/o prostatitis in past. Declines screening today.  Seat belt use discussed. Sunscreen use discussed.  Wt Readings from Last 3 Encounters:  12/03/12 256 lb (116.121 kg)  10/04/12 251 lb 12 oz (114.193 kg)  02/11/11 218 lb (98.884 kg)   Medications and allergies reviewed and updated in chart.  Past histories reviewed and updated if relevant as below. Patient Active Problem List  Diagnosis  . HYPOTHYROIDISM  . HYPERLIPIDEMIA  . HYPERTENSION  . Headache  . SNORING  . DIABETES MELLITUS, TYPE II, BORDERLINE  . NEPHROLITHIASIS, HX OF  . Chronic lower back pain  . History of hepatitis C  . Viral URI with cough   Past Medical History  Diagnosis Date  . Schizoaffective disorder, depressive type     last psychiatrist 2010- worsened after back surgery, Wellbutrin, Prozac, seroquel  . Headache   . Dyslipidemia   . Hypothyroidism   . History of nephrolithiasis 2013  . Obesity   . Chronic lower back pain   . History of chicken pox   . History of hepatitis C     resolved per pt   Past Surgical History  Procedure Laterality Date  . Lumbar fusion  2001    with rods  . Abdominal surgery      hernia?  . Colonoscopy  12 7 10     hemorrhoid, colon polyp, ?colitis   History   Substance Use Topics  . Smoking status: Former Games developer  . Smokeless tobacco: Never Used     Comment: quit in 1980's  . Alcohol Use: No   Family History  Problem Relation Age of Onset  . Diabetes Daughter   . Diabetes Brother   . Cancer Father     throat  . Cancer Paternal Grandfather     throat  . Cancer Paternal Aunt     stomach x2  . Cancer Brother     eye  . Hyperlipidemia Mother   . Hypertension Mother   . CAD Mother     MI x2   No Known Allergies Current Outpatient Prescriptions on File Prior to Visit  Medication Sig Dispense Refill  . albuterol (PROVENTIL HFA;VENTOLIN HFA) 108 (90 BASE) MCG/ACT inhaler Inhale 2 puffs into the lungs every 6 (six) hours as needed for wheezing.  1 Inhaler  1  . SYNTHROID 125 MCG tablet Take 1 tablet (125 mcg total) by mouth daily.  30 tablet  3   No current facility-administered medications on file prior to visit.    Review of Systems  Constitutional: Negative for fever, chills, activity change, appetite change, fatigue and unexpected weight change.  HENT: Negative for hearing loss and neck pain.   Eyes: Positive for visual disturbance (blurry).  Respiratory: Negative for cough,  chest tightness, shortness of breath and wheezing.   Cardiovascular: Negative for chest pain, palpitations and leg swelling.  Gastrointestinal: Positive for constipation. Negative for nausea, vomiting, abdominal pain, diarrhea, blood in stool and abdominal distention.  Genitourinary: Negative for hematuria and difficulty urinating.  Musculoskeletal: Negative for myalgias and arthralgias.  Skin: Negative for rash.  Neurological: Positive for headaches. Negative for dizziness, seizures and syncope.  Hematological: Does not bruise/bleed easily.  Psychiatric/Behavioral: Negative for dysphoric mood. The patient is not nervous/anxious.        Objective:   Physical Exam  Nursing note and vitals reviewed. Constitutional: He is oriented to person, place, and time.  He appears well-developed and well-nourished. No distress.  HENT:  Head: Normocephalic and atraumatic.  Right Ear: Hearing, tympanic membrane, external ear and ear canal normal.  Left Ear: Hearing, tympanic membrane, external ear and ear canal normal.  Nose: Nose normal.  Mouth/Throat: Oropharynx is clear and moist. No oropharyngeal exudate.  Eyes: Conjunctivae and EOM are normal. Pupils are equal, round, and reactive to light. No scleral icterus.  Cataracts bilaterally  Neck: Normal range of motion. Neck supple. Carotid bruit is not present. No thyromegaly present.  Cardiovascular: Normal rate, regular rhythm, normal heart sounds and intact distal pulses.   No murmur heard. Pulses:      Radial pulses are 2+ on the right side, and 2+ on the left side.  Pulmonary/Chest: Effort normal and breath sounds normal. No respiratory distress. He has no wheezes. He has no rales.  Abdominal: Soft. Bowel sounds are normal. He exhibits no distension and no mass. There is no tenderness. There is no rebound and no guarding.  Musculoskeletal: Normal range of motion. He exhibits no edema.  Lymphadenopathy:    He has no cervical adenopathy.  Neurological: He is alert and oriented to person, place, and time.  CN grossly intact, station and gait intact  Skin: Skin is warm and dry. No rash noted.  Psychiatric: He has a normal mood and affect. His behavior is normal. Judgment and thought content normal.      Assessment & Plan:

## 2012-12-03 NOTE — Assessment & Plan Note (Signed)
BP Readings from Last 3 Encounters:  12/03/12 118/76  10/04/12 122/86  02/11/11 100/70  no evidence of this - removed from list.

## 2012-12-03 NOTE — Addendum Note (Signed)
Addended by: Josph Macho A on: 12/03/2012 11:21 AM   Modules accepted: Orders

## 2012-12-03 NOTE — Patient Instructions (Signed)
Tdap contra tetano y pertussis hoy. Sign release form for records of pathology of colon polyp 2010 ([place]) regresar en 1 ano para fisico or cuando necesite. Gusto verlo hoy.

## 2012-12-03 NOTE — Assessment & Plan Note (Signed)
Preventative protocols reviewed and updated unless pt declined. Discussed healthy diet and lifestyle.  Tdap today. 

## 2012-12-03 NOTE — Assessment & Plan Note (Signed)
Body mass index is 36.73 kg/(m^2).  Encouraged increased activity and monitoring diet closely.

## 2013-01-17 ENCOUNTER — Encounter: Payer: Self-pay | Admitting: Family Medicine

## 2013-04-29 ENCOUNTER — Ambulatory Visit (INDEPENDENT_AMBULATORY_CARE_PROVIDER_SITE_OTHER): Payer: Medicare Other | Admitting: Family Medicine

## 2013-04-29 ENCOUNTER — Encounter: Payer: Self-pay | Admitting: Family Medicine

## 2013-04-29 VITALS — BP 136/82 | HR 64 | Temp 97.9°F | Wt 257.2 lb

## 2013-04-29 DIAGNOSIS — E669 Obesity, unspecified: Secondary | ICD-10-CM

## 2013-04-29 DIAGNOSIS — E785 Hyperlipidemia, unspecified: Secondary | ICD-10-CM

## 2013-04-29 DIAGNOSIS — R0789 Other chest pain: Secondary | ICD-10-CM

## 2013-04-29 LAB — BASIC METABOLIC PANEL
BUN: 17 mg/dL (ref 6–23)
CO2: 26 mEq/L (ref 19–32)
Calcium: 10 mg/dL (ref 8.4–10.5)
GFR: 55.93 mL/min — ABNORMAL LOW (ref >60.00)
Glucose, Bld: 88 mg/dL (ref 70–99)
Potassium: 4.5 mEq/L (ref 3.5–5.1)
Sodium: 140 mEq/L (ref 135–145)

## 2013-04-29 LAB — CBC WITH DIFFERENTIAL/PLATELET
Basophils Absolute: 0 10*3/uL (ref 0.0–0.1)
Basophils Relative: 0.6 % (ref 0.0–3.0)
Eosinophils Absolute: 0.1 10*3/uL (ref 0.0–0.7)
Hemoglobin: 14.4 g/dL (ref 13.0–17.0)
MCHC: 33.1 g/dL (ref 30.0–36.0)
MCV: 85 fl (ref 78.0–100.0)
Monocytes Absolute: 0.5 10*3/uL (ref 0.1–1.0)
Neutro Abs: 2.7 10*3/uL (ref 1.4–7.7)
RBC: 5.1 Mil/uL (ref 4.22–5.81)
RDW: 13.3 % (ref 11.5–14.6)

## 2013-04-29 NOTE — Patient Instructions (Signed)
Start aspirin 81mg  enteric coated daily. Start lipitor 20mg  nightly for cholesterol levels. Pass by Marion's office for referral to cardiologist for further evaluation. Continue to monitor blood pressure at home.  Today it's looking fine. Use tylenol as needed for headache for now while we evaluate heart.

## 2013-04-29 NOTE — Assessment & Plan Note (Addendum)
Describes anginal type substernal chest pressure with exertion that was relieved with rest.   Cardiac risk factors include obesity, dyslipidemia, and fmhx. Will obtain EKG today, blood work.  Currently no chest pain present. Start lipitor 20mg  and aspirin 81mg  daily. Refer to cards for further workup/evaluation. Reassuring that his cardio routine normally causes no chest discomfort. Discussed red flags to seek urgent care. Pt agrees with plan.  EKG - sinus bradycardia rate 50s, normal axis, 1st deg AVB, no hypertrophy or acute ST/T changes.   Currently on no AV blocking agents.

## 2013-04-29 NOTE — Progress Notes (Signed)
  Subjective:    Patient ID: Franklin Shannon, male    DOB: Dec 14, 1960, 52 y.o.   MRN: [id number]  HPI CC: ?HTN and HA with chest discomfort  Prior visit, HTN dx was removed from problem list 2/2 good control.  However, over last week noticing elevated blood presures - associated with headache, chest pain, neck pain.  Chest pain occurred on Friday and Saturday, described as substernal pressure tightness.  This tightness started after he was working at gym on hand bike.  Chest pain improved with motrin and rest.  Associated with mild dyspnea, diaphoresis, nausea, posterior neck pain and frontal headache.  No radiation of pain to jaw or arms.  Chest pain was reproducible.  Occasional leg swelling  Currently no chest or neck pain or headache. Denies cough.  Goes daily to gym - 2-3 hours, treadmill, weight lifting, biking.   Increased stress at church.  Activity: gym daily - cardio, weight lifting Diet: good water, fruits/vegetables dialy  BP last week 150/80s.  Lab Results  Component Value Date   TSH 1.21 11/28/2012   BP Readings from Last 3 Encounters:  04/29/13 136/82  12/03/12 118/76  10/04/12 122/86   Wt Readings from Last 3 Encounters:  04/29/13 257 lb 4 oz (116.688 kg)  12/03/12 256 lb (116.121 kg)  10/04/12 251 lb 12 oz (114.193 kg)    Past Medical History  Diagnosis Date  . Schizoaffective disorder, depressive type     last psychiatrist 2010- worsened after back surgery, Wellbutrin, Prozac, seroquel  . Headache(784.0)   . Dyslipidemia   . Hypothyroidism   . History of nephrolithiasis 2013  . Obesity   . Chronic lower back pain   . History of chicken pox   . History of hepatitis C     resolved per pt    Past Surgical History  Procedure Laterality Date  . Lumbar fusion  2001    with rods  . Abdominal surgery      hernia?  . Colonoscopy  08/2009    hyperplastic polyp, ext hem, ?colitis (bx with focal hemorrhage)    Family History  Problem Relation Age of  Onset  . Diabetes Daughter   . Diabetes Brother   . Cancer Father     throat  . Cancer Paternal Grandfather     throat  . Cancer Paternal Aunt     stomach x2  . Cancer Brother     eye  . Hyperlipidemia Mother   . Hypertension Mother   . CAD Mother     MI x2   Review of Systems Per HPI    Objective:   Physical Exam  Nursing note and vitals reviewed. Constitutional: He appears well-developed and well-nourished. No distress.  HENT:  Head: Normocephalic and atraumatic.  Mouth/Throat: Oropharynx is clear and moist. No oropharyngeal exudate.  Eyes: Conjunctivae and EOM are normal. Pupils are equal, round, and reactive to light. No scleral icterus.  Cardiovascular: Normal rate, regular rhythm, normal heart sounds and intact distal pulses.   No murmur heard. Pulmonary/Chest: Effort normal and breath sounds normal. No respiratory distress. He has no wheezes. He has no rales. He exhibits no tenderness.  Musculoskeletal: He exhibits no edema.  Skin: Skin is warm and dry. No rash noted.  Psychiatric: He has a normal mood and affect.       Assessment & Plan:

## 2013-04-30 ENCOUNTER — Other Ambulatory Visit: Payer: Self-pay | Admitting: *Deleted

## 2013-04-30 MED ORDER — ATORVASTATIN CALCIUM 20 MG PO TABS: 20.0000 mg | ORAL_TABLET | Freq: Every day | ORAL | Status: DC

## 2013-05-20 ENCOUNTER — Other Ambulatory Visit: Payer: Self-pay | Admitting: Family Medicine

## 2013-05-29 ENCOUNTER — Ambulatory Visit (INDEPENDENT_AMBULATORY_CARE_PROVIDER_SITE_OTHER): Payer: Medicare Other | Admitting: Cardiology

## 2013-05-29 ENCOUNTER — Encounter: Payer: Self-pay | Admitting: Cardiology

## 2013-05-29 VITALS — BP 136/65 | HR 58 | Ht 70.0 in | Wt 264.8 lb

## 2013-05-29 DIAGNOSIS — R200 Anesthesia of skin: Secondary | ICD-10-CM

## 2013-05-29 DIAGNOSIS — E785 Hyperlipidemia, unspecified: Secondary | ICD-10-CM

## 2013-05-29 DIAGNOSIS — R0789 Other chest pain: Secondary | ICD-10-CM

## 2013-05-29 DIAGNOSIS — G459 Transient cerebral ischemic attack, unspecified: Secondary | ICD-10-CM

## 2013-05-29 DIAGNOSIS — R209 Unspecified disturbances of skin sensation: Secondary | ICD-10-CM

## 2013-05-29 NOTE — Patient Instructions (Addendum)
Your physician recommends that you schedule a follow-up appointment in: AS NEEDED PENDING TEST RESULTS  Your physician has requested that you have a carotid duplex. This test is an ultrasound of the carotid arteries in your neck. It looks at blood flow through these arteries that supply the brain with blood. Allow one hour for this exam. There are no restrictions or special instructions.   Your physician has requested that you have an echocardiogram. Echocardiography is a painless test that uses sound waves to create images of your heart. It provides your doctor with information about the size and shape of your heart and how well your heart's chambers and valves are working. This procedure takes approximately one hour. There are no restrictions for this procedure.   Your physician has requested that you have an exercise tolerance test. For further information please visit [email or web address] Please also follow instruction sheet, as given.   Exercise Stress Electrocardiography An exercise stress test is a heart test (EKG) which is done while you are moving. You will walk on a treadmill. This test will tell your doctor how your heart does when it is forced to work harder and how much activity you can safely handle. BEFORE THE TEST  Wear shorts or athletic pants.  Wear comfortable tennis shoes.  Women need to wear a bra that allows patches to be put on under it. TEST  An EKG cable will be attached to your waist. This cable is hooked up to patches, which look like round stickers stuck to your chest.  You will be asked to walk on the treadmill.  You will walk until you are too tired or until you are told to stop.  Tell the doctor right away if you have:  Chest pain.  Leg cramps.  Shortness of breath.  Dizziness.  The test may last 30 minutes to 1 hour. The timing depends on your physical condition and the condition of your heart. AFTER THE TEST  You will rest for about 6 minutes.  During this time, your heart rhythm and blood pressure will be checked.  The testing equipment will be removed from your body and you can get dressed.  You may go home or back to your hospital room. You may keep doing all your usual activities as told by your doctor. Finding out the results of your test Ask when your test results will be ready. Make sure you get your test results. Document Released: 02/29/2008 Document Revised: 12/05/2011 Document Reviewed: 02/29/2008 [facility] Patient Information 2014 [place], [place].

## 2013-05-29 NOTE — Assessment & Plan Note (Signed)
Patient had transient numbness on the right side of his face. Doubt TIA but will schedule carotids and echocardiogram.

## 2013-05-29 NOTE — Assessment & Plan Note (Signed)
Symptoms very atypical. Will arrange exercise treadmill for risk stratification.

## 2013-05-29 NOTE — Assessment & Plan Note (Signed)
Management per primary care. 

## 2013-05-29 NOTE — Progress Notes (Signed)
HPI: 52 year old male for evaluation of chest pain. Patient has had intermittent chest pain since 2008 by his report. It occurs when he is stressed. It lasts 8-10 hours and resolve spontaneously. It is substernal without radiation. He describes shortness of breath, nausea and diaphoresis. It is not pleuritic or positional and not related to food. He does not have exertional chest pain, dyspnea on exertion, orthopnea, PND, pedal edema or syncope. Several weeks ago he had an episode of numbness on the right side of his face with no other associated neurological symptoms.  Current Outpatient Prescriptions  Medication Sig Dispense Refill  . albuterol (PROVENTIL HFA;VENTOLIN HFA) 108 (90 BASE) MCG/ACT inhaler Inhale 2 puffs into the lungs every 6 (six) hours as needed for wheezing.  1 Inhaler  1  . aspirin 81 MG tablet Take 81 mg by mouth as needed for pain.      Marland Kitchen atorvastatin (LIPITOR) 20 MG tablet Take 1 tablet (20 mg total) by mouth daily.  30 tablet  3  . SYNTHROID 125 MCG tablet TAKE 1 TABLET BY MOUTH EVERY DAY  30 tablet  11   No current facility-administered medications for this visit.    No Known Allergies  Past Medical History  Diagnosis Date  . Schizoaffective disorder, depressive type     last psychiatrist 2010- worsened after back surgery, Wellbutrin, Prozac, seroquel  . Headache(784.0)   . Dyslipidemia   . Hypothyroidism   . History of nephrolithiasis 2013  . Obesity   . Chronic lower back pain   . History of chicken pox   . History of hepatitis C     resolved per pt    Past Surgical History  Procedure Laterality Date  . Lumbar fusion  2001    with rods  . Abdominal surgery      hernia?  . Colonoscopy  08/2009    hyperplastic polyp, ext hem, ?colitis (bx with focal hemorrhage)     History   Social History  . Marital Status: Married    Spouse Name: N/A    Number of Children: 2  . Years of Education: N/A   Occupational History  .     Social History Main  Topics  . Smoking status: Former Games developer  . Smokeless tobacco: Never Used     Comment: quit in 1980's  . Alcohol Use: No  . Drug Use: No  . Sexual Activity: Not on file   Other Topics Concern  . 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 Diabetes Daughter   . Diabetes Brother   . Cancer Father     throat  . Cancer Paternal Grandfather     throat  . Cancer Paternal Aunt     stomach x2  . Cancer Brother     eye  . Hyperlipidemia Mother   . Hypertension Mother   . CAD Mother 75    MI x2    ROS: no fevers or chills, productive cough, hemoptysis, dysphasia, odynophagia, melena, hematochezia, dysuria, hematuria, rash, seizure activity, orthopnea, PND, pedal edema, claudication.  Remaining systems are negative.  Physical Exam:   Blood pressure 136/65, pulse 58, height 5\' 10"  (1.778 m), weight 264 lb 12.8 oz (120.112 kg).  General:  Well developed/well nourished in NAD Skin warm/dry Patient not depressed No peripheral clubbing Back-normal HEENT-normal/normal eyelids Neck supple/normal carotid upstroke bilaterally; no bruits; no JVD; no thyromegaly chest - CTA/ normal expansion CV - RRR/normal S1 and S2; no murmurs, rubs or gallops;  PMI nondisplaced Abdomen -NT/ND, no HSM, no mass, + bowel sounds, no bruit 2+ femoral pulses, no bruits Ext-no edema, chords, 2+ DP Neuro-grossly nonfocal  ECG sinus rhythm with first degree AV block. No ST changes.

## 2013-06-03 DIAGNOSIS — R0989 Other specified symptoms and signs involving the circulatory and respiratory systems: Secondary | ICD-10-CM

## 2013-06-06 ENCOUNTER — Other Ambulatory Visit (HOSPITAL_COMMUNITY): Payer: Medicare Other

## 2013-06-14 ENCOUNTER — Telehealth: Payer: Self-pay | Admitting: *Deleted

## 2013-06-14 ENCOUNTER — Encounter: Payer: Medicare Other | Admitting: Nurse Practitioner

## 2013-06-14 NOTE — Telephone Encounter (Signed)
Pt No Show for ETT 06/14/13 TK

## 2014-02-10 ENCOUNTER — Encounter: Payer: Self-pay | Admitting: Family Medicine

## 2014-02-10 ENCOUNTER — Ambulatory Visit (INDEPENDENT_AMBULATORY_CARE_PROVIDER_SITE_OTHER): Payer: Medicare Other | Admitting: Family Medicine

## 2014-02-10 VITALS — BP 114/76 | HR 84 | Temp 98.1°F | Wt 284.8 lb

## 2014-02-10 DIAGNOSIS — M7711 Lateral epicondylitis, right elbow: Secondary | ICD-10-CM | POA: Insufficient documentation

## 2014-02-10 DIAGNOSIS — L439 Lichen planus, unspecified: Secondary | ICD-10-CM | POA: Insufficient documentation

## 2014-02-10 DIAGNOSIS — M7710 Lateral epicondylitis, unspecified elbow: Secondary | ICD-10-CM

## 2014-02-10 DIAGNOSIS — R21 Rash and other nonspecific skin eruption: Secondary | ICD-10-CM

## 2014-02-10 MED ORDER — NAPROXEN 500 MG PO TABS: ORAL_TABLET | ORAL | Status: DC

## 2014-02-10 MED ORDER — CLOBETASOL PROPIONATE 0.05 % EX CREA: 1.0000 "application " | TOPICAL_CREAM | Freq: Two times a day (BID) | CUTANEOUS | Status: DC

## 2014-02-10 MED ORDER — ALBUTEROL SULFATE HFA 108 (90 BASE) MCG/ACT IN AERS: 2.0000 | INHALATION_SPRAY | Freq: Four times a day (QID) | RESPIRATORY_TRACT | Status: AC | PRN

## 2014-02-10 NOTE — Assessment & Plan Note (Signed)
Treat with short course of naprosyn (with kidney precautions discussed) and tennis elbow offloading brace provided today.  [facility] elbow. Update if sxs persist or fail to improve.

## 2014-02-10 NOTE — Assessment & Plan Note (Signed)
Rash consistent with nummular eczema - treat with high potency steroid cream.  Discussed importance of regular moisturizing. Discussed need to return if rash worsens on topical steroid. Doubt psoriasis, infection or other etiology. Pt education handout provided.

## 2014-02-10 NOTE — Progress Notes (Signed)
   BP 114/76  Pulse 84  Temp(Src) 98.1 F (36.7 C) (Oral)  Wt 284 lb 12 oz (129.162 kg)   CC: R elbow, R arm and leg  Subjective:    Patient ID: Franklin Shannon, male    DOB: 21-Aug-1961, 53 y.o.   MRN: [id number]  HPI: Franklin Shannon is a 53 y.o. male presenting on 02/10/2014 for Elbow Pain and Rash   R elbow pain - several month h/o R posterior and lateral elbow pain.  Denies inciting trauma/injury to elbow.  Did have some joint pain recently but not anymore.  Has treated with aleve motrin and tylenol, excedrin.  Rash - R ventral forearm present for last 1-2 months.  Self treated with hydrocortisone cream, some antifungal cream and didn't help.  Initially with rash on left abdomen and L thigh but that has since resolved with using aveeno moisturizing cream. Did initially have fever and malaise 2-3 months ago.  No vomiting. No new foods, new medications, new lotions, detergents, soaps or shampoos.  Significant allergic rhinitis this season.  Self treated with robitussin, mucinex, nyquil, benadryl.  Relevant past medical, surgical, family and social history reviewed and updated as indicated.  Allergies and medications reviewed and updated. Current Outpatient Prescriptions on File Prior to Visit  Medication Sig  . atorvastatin (LIPITOR) 20 MG tablet Take 1 tablet (20 mg total) by mouth daily.  Marland Kitchen. SYNTHROID 125 MCG tablet TAKE 1 TABLET BY MOUTH EVERY DAY  . aspirin 81 MG tablet Take 81 mg by mouth as needed for pain.   No current facility-administered medications on file prior to visit.    Review of Systems Per HPI unless specifically indicated above    Objective:    BP 114/76  Pulse 84  Temp(Src) 98.1 F (36.7 C) (Oral)  Wt 284 lb 12 oz (129.162 kg)  Physical Exam  Nursing note and vitals reviewed. Constitutional: He appears well-developed and well-nourished. No distress.  Musculoskeletal: He exhibits no edema.  FROM at R elbow. Point tender to palpation at lateral  epicondyle Tender with testing of wrist extension and forearm pronation against resistance.  Skin: Skin is warm and dry. Rash noted.     Round coin shaped patches of dry thickened skin, pruritic, some erythema along border of lesions.  No scaling. Fainter erythematous papular rash L>R forearms       Assessment & Plan:  Pt will return in 2-3 mo for fasting blood work and afterwards for CPE as due.  Problem List Items Addressed This Visit   Right tennis elbow - Primary     Treat with short course of naprosyn (with kidney precautions discussed) and tennis elbow offloading brace provided today.  [facility] tennis elbow. Update if sxs persist or fail to improve.    Relevant Medications      naproxen (NAPROSYN) tablet   Skin rash     Rash consistent with nummular eczema - treat with high potency steroid cream.  Discussed importance of regular moisturizing. Discussed need to return if rash worsens on topical steroid. Doubt psoriasis, infection or other etiology. Pt education handout provided.        Follow up plan: Return in about 3 months (around 05/13/2014), or if symptoms worsen or fail to improve, for annual exam, prior fasting for blood work.

## 2014-02-10 NOTE — Patient Instructions (Addendum)
creo que tiene un tipo de eczema llamado nummular eczema.  trate con seguido uso de aveeno y crema de esteroide recetada hoy. Para codo - tiene tennis elbow.  Brace placed today, use stretching exercise provided today, take naprosyn 500mg  twice daily for 5 days then as needed. regresar en 2-3 meses para examen de sangre en ayunas y luego fisico.  Eczema (Eczema) El eczema, tambin llamada dermatitis atpica, es una afeccin de la piel que causa inflamacin de la misma. Este trastorno produce una erupcin roja y sequedad y escamas en la piel. Hay gran picazn. El eczema generalmente empeora durante los meses fros del invierno y generalmente desaparece o mejora con el tiempo clido del verano. El eczema generalmente comienza a manifestarse en la infancia. Algunos nios desarrollan este trastorno y ste puede prolongarse en la Estate manager/land agentadultez.  CAUSAS  La causa exacta no se conoce pero parece ser una afeccin hereditaria. Generalmente las personas que sufren eczema tienen una historia familiar de eczema, alergias, asma o fiebre de heno. Esta enfermedad no es contagiosa. Algunas causas de los brotes pueden ser:   Contacto con alguna cosa a la que es sensible o Best boyalrgico.  Librarian, academicstrs. SIGNOS Y SNTOMAS  Piel seca y escamosa.  Erupcin roja y que pica.  Picazn. Esta puede ocurrir antes de que aparezca la erupcin y puede ser muy intensa. DIAGNSTICO  El diagnstico de eczema se realiza basndose en los sntomas y en la historia clnica. TRATAMIENTO  El eczema no puede curarse, pero los sntomas generalmente pueden controlarse con tratamiento y Development worker, communityotras estrategias. Un plan de tratamiento puede incluir:  Control de la picazn y el rascado.  Utilice antihistamnicos de venta libre segn las indicaciones, para Associate Professoraliviar la picazn. Es especialmente til por las noches cuando la picazn tiende a Theme park managerempeorar.  Utilice medicamentos de venta libre para la picazn, segn las indicaciones del mdico.  Evite rascarse.  El rascado hace que la picazn empeore. Tambin puede producir una infeccin en la piel (imptigo) debido a las lesiones en la piel causadas por el rascado.  Mantenga la piel bien humectada con cremas, todos [place] das. La piel quedar hmeda y ayudar a prevenir la sequedad. Las lociones que contengan alcohol y agua deben evitarse debido a que pueden Best boysecar la piel.  Limite la exposicin a las cosas a las que es sensible o alrgico (alrgenos).  Reconozca las situaciones que puedan causar estrs.  Desarrolle un plan para controlar el estrs. INSTRUCCIONES PARA EL CUIDADO EN EL HOGAR   Tome slo medicamentos de venta libre o recetados, segn las indicaciones del mdico.  No aplique nada sobre la piel sin Science writerconsultar a su mdico.  Deber tomar baos o duchas de corta duracin (5 minutos) en agua tibia (no caliente). Use jabones suaves para el bao. No deben tener perfume. Puede agregar aceite de bao no perfumado al agua del bao. Es Manufacturing engineermejor evitar el jabn y el bao de espuma.  Inmediatamente despus del bao o de la ducha, cuando la piel aun est hmeda, aplique una crema humectante en todo el cuerpo. Este ungento debe ser en base a vaselina. La piel quedar hmeda y ayudar a prevenir la sequedad. Cuanto ms espeso sea el ungento, mejor. No deben tener perfume.  Mantenga las uas cortas. Es posible que los nios con eczema necesiten usar guantes o mitones por la noche, despus de aplicarse el ungento.  Vista al McGraw-Hillnio con ropa de algodn o Chief of Staffmezcla de algodn. Vstalo con ropas ligeras ya que el calor aumenta la picazn.  Un nio con eczema debe permanecer alejado de personas que tengan ampollas febriles o llagas del resfro. El virus que causa las ampollas febriles (herpes simple) puede ocasionar una infeccin grave en la piel de los nios que padecen eczema. SOLICITE ATENCIN MDICA SI:   La picazn le impide dormir.  La erupcin empeora o no mejora dentro de la semana en la que se inicia el  Powaytratamiento.  Observa pus o costras amarillas en la zona de la erupcin.  Tiene fiebre.  Aparece un brote despus de haber estado en contacto con alguna persona que tiene ampollas febriles. Document Released: 09/12/2005 Document Revised: 07/03/2013 [facility] Patient Information 2014 [place].

## 2014-02-10 NOTE — Progress Notes (Signed)
Pre visit review using our clinic review tool, if applicable. No additional management support is needed unless otherwise documented below in the visit note. 

## 2014-03-09 ENCOUNTER — Other Ambulatory Visit: Payer: Self-pay | Admitting: Family Medicine

## 2014-05-10 ENCOUNTER — Other Ambulatory Visit: Payer: Self-pay | Admitting: Family Medicine

## 2014-05-10 DIAGNOSIS — E669 Obesity, unspecified: Secondary | ICD-10-CM

## 2014-05-10 DIAGNOSIS — Z125 Encounter for screening for malignant neoplasm of prostate: Secondary | ICD-10-CM

## 2014-05-10 DIAGNOSIS — E785 Hyperlipidemia, unspecified: Secondary | ICD-10-CM

## 2014-05-10 DIAGNOSIS — E039 Hypothyroidism, unspecified: Secondary | ICD-10-CM

## 2014-05-14 ENCOUNTER — Other Ambulatory Visit (INDEPENDENT_AMBULATORY_CARE_PROVIDER_SITE_OTHER): Payer: Medicare Other

## 2014-05-14 DIAGNOSIS — E039 Hypothyroidism, unspecified: Secondary | ICD-10-CM

## 2014-05-14 DIAGNOSIS — Z125 Encounter for screening for malignant neoplasm of prostate: Secondary | ICD-10-CM

## 2014-05-14 DIAGNOSIS — E785 Hyperlipidemia, unspecified: Secondary | ICD-10-CM

## 2014-05-14 NOTE — Addendum Note (Signed)
Addended by: Liane ComberHAVERS, Flint Hakeem C on: 05/14/2014 11:28 AM   Modules accepted: Orders

## 2014-05-15 LAB — BASIC METABOLIC PANEL
BUN / CREAT RATIO: 14 (ref 9–20)
BUN: 19 mg/dL (ref 6–24)
CO2: 20 mmol/L (ref 18–29)
Calcium: 10.2 mg/dL (ref 8.7–10.2)
Chloride: 103 mmol/L (ref 97–108)
Creatinine, Ser: 1.39 mg/dL — ABNORMAL HIGH (ref 0.76–1.27)
GFR, EST AFRICAN AMERICAN: 66 mL/min/{1.73_m2} (ref >59)
GFR, EST NON AFRICAN AMERICAN: 57 mL/min/{1.73_m2} — AB (ref >59)
Glucose: 95 mg/dL (ref 65–99)
POTASSIUM: 4.5 mmol/L (ref 3.5–5.2)
SODIUM: 141 mmol/L (ref 134–144)

## 2014-05-15 LAB — LIPID PANEL
CHOLESTEROL TOTAL: 202 mg/dL — AB (ref 100–199)
Chol/HDL Ratio: 5.1 ratio units — ABNORMAL HIGH (ref 0.0–5.0)
HDL: 40 mg/dL (ref >39)
LDL Calculated: 140 mg/dL — ABNORMAL HIGH (ref 0–99)
Triglycerides: 109 mg/dL (ref 0–149)
VLDL Cholesterol Cal: 22 mg/dL (ref 5–40)

## 2014-05-15 LAB — PSA: PSA: 0.5 ng/mL (ref 0.0–4.0)

## 2014-05-15 LAB — TSH: TSH: 2.96 u[IU]/mL (ref 0.450–4.500)

## 2014-05-19 ENCOUNTER — Encounter: Payer: Self-pay | Admitting: Family Medicine

## 2014-05-19 ENCOUNTER — Ambulatory Visit (INDEPENDENT_AMBULATORY_CARE_PROVIDER_SITE_OTHER): Payer: Medicare Other | Admitting: Family Medicine

## 2014-05-19 ENCOUNTER — Encounter (INDEPENDENT_AMBULATORY_CARE_PROVIDER_SITE_OTHER): Payer: Self-pay

## 2014-05-19 VITALS — BP 126/76 | HR 80 | Temp 98.1°F | Ht 70.0 in | Wt 290.5 lb

## 2014-05-19 DIAGNOSIS — R21 Rash and other nonspecific skin eruption: Secondary | ICD-10-CM

## 2014-05-19 DIAGNOSIS — E785 Hyperlipidemia, unspecified: Secondary | ICD-10-CM

## 2014-05-19 DIAGNOSIS — Z0000 Encounter for general adult medical examination without abnormal findings: Secondary | ICD-10-CM

## 2014-05-19 DIAGNOSIS — R51 Headache: Secondary | ICD-10-CM

## 2014-05-19 DIAGNOSIS — G4730 Sleep apnea, unspecified: Secondary | ICD-10-CM

## 2014-05-19 DIAGNOSIS — E669 Obesity, unspecified: Secondary | ICD-10-CM

## 2014-05-19 DIAGNOSIS — E039 Hypothyroidism, unspecified: Secondary | ICD-10-CM

## 2014-05-19 DIAGNOSIS — R32 Unspecified urinary incontinence: Secondary | ICD-10-CM

## 2014-05-19 LAB — POCT URINALYSIS DIPSTICK
BILIRUBIN UA: NEGATIVE
GLUCOSE UA: NEGATIVE
KETONES UA: NEGATIVE
Leukocytes, UA: NEGATIVE
Nitrite, UA: NEGATIVE
Protein, UA: NEGATIVE
RBC UA: NEGATIVE
Spec Grav, UA: 1.025
Urobilinogen, UA: 0.2
pH, UA: 6

## 2014-05-19 MED ORDER — SYNTHROID 125 MCG PO TABS: ORAL_TABLET | ORAL | Status: DC

## 2014-05-19 MED ORDER — ATORVASTATIN CALCIUM 20 MG PO TABS: 20.0000 mg | ORAL_TABLET | ORAL | Status: DC

## 2014-05-19 MED ORDER — FLUTICASONE PROPIONATE 50 MCG/ACT NA SUSP: 2.0000 | Freq: Every day | NASAL | Status: AC

## 2014-05-19 NOTE — Progress Notes (Signed)
Pre visit review using our clinic review tool, if applicable. No additional management support is needed unless otherwise documented below in the visit note. 

## 2014-05-19 NOTE — Assessment & Plan Note (Addendum)
I have personally reviewed the Medicare Annual Wellness questionnaire and have noted 1. The patient's medical and social history 2. Their use of alcohol, tobacco or illicit drugs 3. Their current medications and supplements 4. The patient's functional ability including ADL's, fall risks, home safety risks and hearing or visual impairment. 5. Diet and physical activity 6. Evidence for depression or mood disorders The patients weight, height, BMI have been recorded in the chart.  Hearing and vision has been addressed. I have made referrals, counseling and provided education to the patient based review of the above and I have provided the pt with a written personalized care plan for preventive services. Provider list updated - see scanned questionairre. Advanced directives not discussed Depression/adjustment disorder discussed - pt would like to monitor sxs for next 1-2 mo and will let me know if sxs worsening. No SI/HI.  Reviewed preventative protocols and updated unless pt declined.

## 2014-05-19 NOTE — Progress Notes (Signed)
BP 126/76  Pulse 80  Temp(Src) 98.1 F (36.7 C) (Oral)  Ht  (1.778 m)  Wt 290 lb 8 oz (131.77 kg)  BMI 41.68 kg/m2   CC: CPE  Subjective:    Patient ID: Franklin Shannon, male    DOB: 07/17/1961, 53 y.o.   MRN: [id number]  HPI: Franklin Shannon is a 53 y.o. male presenting on 05/19/2014 for Annual Exam   On disability for back surgery 2001 [facility].  Hypothyroid - controlled on current synthroid dose of daily.  2 wks ago with fevers and headache and chest pain. + cough, congestion, ST and PNdrainage. No recent fevers since. No sick contacts at home, no smokers at home. No h/o asthma. No recent tick bite he knows of.  Skin rash - not improved with clobetasol cream. Requests derm referral. Tried wife's cream as well which didn't help.  Takes naprosyn  prn joint aches. Has also been taking motrin/aleve regularly. + spring allergies but denies an issue currently.  Increased stress at church.  Wt Readings from Last 3 Encounters:  05/19/14 290 lb 8 oz (131.77 kg)  02/10/14 284 lb 12 oz (129.162 kg)  05/29/13 264 lb 12.8 oz (120.112 kg)   Body mass index is 41.68 kg/(m^2). Weight gain noted despite daily working out. Watching diet.  Passes hearing and vision screens. Denies falls + depressed mood PHQ9 = 17/27. Declines medication for this currently. Denies SI/HI. Ongoing sxs for past 3-4 weeks. Has seen psychiatrist in the past, has not returned since. Prior on prozac, wellbutrin and effexor which helped.  Preventative: COLONOSCOPY Date: 08/2009 hyperplastic polyp, ext hem, ?colitis (bx with focal hemorrhage)  Prostate screening - no h/o screening. H/o prostatitis in past. Requests PSA today but declines DRE. Endorsing some urge incontinence. No dysuria or abd pain. Flu shot - 09/2012 Tdap 11/2012 Seat belt use discussed.  Sunscreen use discussed, no suspicious moles. advanced directives: did not discuss today.  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, [place]  Activity: gym daily - cardio Diet: good water, fruits/vegetables dialy   Relevant past medical, surgical, family and social history reviewed and updated as indicated.  Allergies and medications reviewed and updated. Current Outpatient Prescriptions on File Prior to Visit  Medication Sig  . albuterol (PROVENTIL HFA;VENTOLIN HFA) 108 (90 BASE) MCG/ACT inhaler Inhale 2 puffs into the lungs every 6 (six) hours as needed for wheezing.  Marland Kitchen aspirin 81 MG tablet Take 81 mg by mouth as needed for pain.  . naproxen (NAPROSYN) 500 MG tablet TAKE 1 TABLET BY MOUTH TWICE DAILY FOR 1 WEEK THEN AS NEEDED.TAKE WITH FOOD   No current facility-administered medications on file prior to visit.    Review of Systems  Constitutional: Positive for fever and unexpected weight change (gain). Negative for chills, activity change, appetite change and fatigue.  HENT: Positive for congestion, rhinorrhea, sinus pressure and sore throat. Negative for hearing loss.   Eyes: Negative for visual disturbance.  Respiratory: Positive for cough. Negative for chest tightness, shortness of breath and wheezing.   Cardiovascular: Negative for chest pain, palpitations and leg swelling.  Gastrointestinal: Positive for constipation. Negative for nausea, vomiting, abdominal pain, diarrhea, blood in stool and abdominal distention.  Genitourinary: Negative for hematuria and difficulty urinating.  Musculoskeletal: Negative for arthralgias and neck pain.  Skin:  Negative for rash.  Neurological: Positive for dizziness and headaches. Negative for seizures and syncope.  Hematological: Negative for adenopathy. Does not bruise/bleed easily.  Psychiatric/Behavioral: Positive for dysphoric mood. The patient is not nervous/anxious.    Per HPI unless specifically  indicated above    Objective:    BP 126/76  Pulse 80  Temp(Src) 98.1 F (36.7 C) (Oral)  Ht  (1.778 m)  Wt 290 lb 8 oz (131.77 kg)  BMI 41.68 kg/m2  Physical Exam  Nursing note and vitals reviewed. Constitutional: He is oriented to person, place, and time. He appears well-developed and well-nourished. No distress.  HENT:  Head: Normocephalic and atraumatic.  Right Ear: Hearing, tympanic membrane, external ear and ear canal normal.  Left Ear: Hearing, tympanic membrane, external ear and ear canal normal.  Nose: Nose normal.  Mouth/Throat: Uvula is midline, oropharynx is clear and moist and mucous membranes are normal. No oropharyngeal exudate, posterior oropharyngeal edema or posterior oropharyngeal erythema.  Eyes: Conjunctivae and EOM are normal. Pupils are equal, round, and reactive to light. No scleral icterus.  Neck: Normal range of motion. Neck supple. Carotid bruit is not present. No thyromegaly present.  Cardiovascular: Normal rate, regular rhythm, normal heart sounds and intact distal pulses.   No murmur heard. Pulses:      Radial pulses are 2+ on the right side, and 2+ on the left side.  Pulmonary/Chest: Effort normal and breath sounds normal. No respiratory distress. He has no wheezes. He has no rales.  Abdominal: Soft. Bowel sounds are normal. He exhibits no distension and no mass. There is no tenderness. There is no rebound and no guarding.  Genitourinary:  Patient declines  Musculoskeletal: Normal range of motion. He exhibits no edema.  Lymphadenopathy:    He has no cervical adenopathy.  Neurological: He is alert and oriented to person, place, and time.  CN grossly intact, station and gait intact  Skin: Skin is warm and dry. Rash noted.  Hyperpigmented patches on skin - R forearm, R leg, L lateral hip - rough and scaly and pruritic  Psychiatric: He has a normal mood and affect. His behavior is normal. Judgment and thought content normal.   Results for orders  placed in visit on 05/14/14  TSH      Result Value Ref Range   TSH 2.960  0.450 - 4.500 uIU/mL  PSA      Result Value Ref Range   PSA 0.5  0.0 - 4.0 ng/mL  LIPID PANEL      Result Value Ref Range   Cholesterol, Total 202 (*) 100 - 199 mg/dL   Triglycerides 308  0 - 149 mg/dL   HDL 40  >65 mg/dL   VLDL Cholesterol Cal 22  5 - 40 mg/dL   LDL Calculated 784 (*) 0 - 99 mg/dL   Chol/HDL Ratio 5.1 (*) 0.0 - 5.0 ratio units  BASIC METABOLIC PANEL      Result Value Ref Range   Glucose 95  65 - 99 mg/dL   BUN 19  6 - 24 mg/dL   Creatinine, Ser 6.96 (*) 0.76 - 1.27 mg/dL   GFR calc non Af Amer 57 (*) >59 mL/min/1.73   GFR calc Af Amer 66  >59 mL/min/1.73   BUN/Creatinine Ratio 14  9 - 20   Sodium 141  134 - 144 mmol/L   Potassium 4.5  3.5 - 5.2 mmol/L   Chloride 103  97 - 108 mmol/L   CO2 20  18 -  29 mmol/L   Calcium 10.2  8.7 - 10.2 mg/dL      Assessment & Plan:   Problem List Items Addressed This Visit   Dyslipidemia     Has not been regular with lipitor - states causes constipation. Encouraged regular use of QOD dosing.  rec start ASA daily given fmhx.    Relevant Medications      atorvastatin (LIPITOR) tablet   Headache (Chronic)     Persistent headache which sounds like sinus pressure - recommend start flonase, update if fever persists. Not consistent with infection today.    Healthcare maintenance     Preventative protocols reviewed and updated unless pt declined. Discussed healthy diet and lifestyle.     HYPOTHYROIDISM     Chronic, stable. Continue regimen of brand synthroid daily.    Relevant Medications      SYNTHROID 125 MCG tablet   Medicare annual wellness visit, initial - Primary     I have personally reviewed the Medicare Annual Wellness questionnaire and have noted 1. The patient's medical and social history 2. Their use of alcohol, tobacco or illicit drugs 3. Their current medications and supplements 4. The patient's functional ability including  ADL's, fall risks, home safety risks and hearing or visual impairment. 5. Diet and physical activity 6. Evidence for depression or mood disorders The patients weight, height, BMI have been recorded in the chart.  Hearing and vision has been addressed. I have made referrals, counseling and provided education to the patient based review of the above and I have provided the pt with a written personalized care plan for preventive services. Provider list updated - see scanned questionairre. Advanced directives not discussed Depression/adjustment disorder discussed - pt would like to monitor sxs for next 1-2 mo and will let me know if sxs worsening. No SI/HI.  Reviewed preventative protocols and updated unless pt declined.    Obesity     Weight gain noted. Continue to encourage healthy diet/lifestyle changes to better control chol levels. Encouraged referral for sleep study Body mass index is 41.68 kg/(m^2).    Skin rash     Previously thought due to nummular eczema but has not responded to high potency steroid cream. Will refer to dermatology for further eval and r/o other cause like psoriasis, infection, etc.    Relevant Orders      Ambulatory referral to Dermatology   Unspecified sleep apnea     Presumed - pt declines sleep study referral. States this has been recommended in past but pt continues to decline. Discussed reasons to treat sleep apnea as well as sequela of untreated condition.        Follow up plan: Return in about 6 months (around 11/19/2014), or as needed, for follow up visit.

## 2014-05-19 NOTE — Assessment & Plan Note (Signed)
Previously thought due to nummular eczema but has not responded to high potency steroid cream. Will refer to dermatology for further eval and r/o other cause like psoriasis, infection, etc.

## 2014-05-19 NOTE — Assessment & Plan Note (Signed)
Weight gain noted. Continue to encourage healthy diet/lifestyle changes to better control chol levels. Encouraged referral for sleep study Body mass index is 41.68 kg/(m^2).

## 2014-05-19 NOTE — Assessment & Plan Note (Signed)
Has not been regular with lipitor - states causes constipation. Encouraged regular use of QOD dosing.  rec start ASA daily given fmhx.

## 2014-05-19 NOTE — Addendum Note (Signed)
Addended by: Josph Macho A on: 05/19/2014 01:01 PM   Modules accepted: Orders

## 2014-05-19 NOTE — Assessment & Plan Note (Signed)
Persistent headache which sounds like sinus pressure - recommend start flonase, update if fever persists. Not consistent with infection today.

## 2014-05-19 NOTE — Patient Instructions (Addendum)
Take lipitor  every other day. Start aspirin  daily given family history. Low cholesterol diet handout provided today. Urinalysis today to rule out infection. We will refer you to dermatologist to check skin spots. For headaches - I wonder about sinus pressure headaches and recommend trial of flonase (sent to pharmacy) For mood - let me know if any worsening for further treatment options. Consider sleep study.

## 2014-05-19 NOTE — Assessment & Plan Note (Signed)
Presumed - pt declines sleep study referral. States this has been recommended in past but pt continues to decline. Discussed reasons to treat sleep apnea as well as sequela of untreated condition.

## 2014-05-19 NOTE — Assessment & Plan Note (Signed)
Preventative protocols reviewed and updated unless pt declined. Discussed healthy diet and lifestyle.  

## 2014-05-19 NOTE — Assessment & Plan Note (Signed)
Chronic, stable. Continue regimen of brand synthroid daily.

## 2014-06-30 ENCOUNTER — Encounter: Payer: Self-pay | Admitting: Family Medicine

## 2014-09-08 ENCOUNTER — Telehealth: Payer: Self-pay | Admitting: Family Medicine

## 2014-09-08 NOTE — Telephone Encounter (Signed)
Morene CrockerJane Guarini called in this morning and is a [facility] with [facility] and did a yearly home visit with Mr Cena BentonVega on Friday and wanted Dr Reece AgarG to know that he scored a 20/27 on the depression scale and rated his back pain a 7/10. She advised that maybe he need to be treated with depression meds? She did do a follow up phone call yesterday and Mr Cena BentonVega did say that he was feeling a little better emotionally. Any questions please call her @ [phone].

## 2014-09-14 NOTE — Telephone Encounter (Signed)
plz reach out to patient to schedule f/u appt to discuss back and mood if pt desires.

## 2014-09-15 NOTE — Telephone Encounter (Signed)
Spoke with patient and appt scheduled. Jane notified.

## 2014-09-23 ENCOUNTER — Ambulatory Visit (INDEPENDENT_AMBULATORY_CARE_PROVIDER_SITE_OTHER)
Admission: RE | Admit: 2014-09-23 | Discharge: 2014-09-23 | Disposition: A | Discharge status: Home / Self Care | Payer: Medicare Other | Source: Ambulatory Visit | Attending: Family Medicine | Admitting: Family Medicine

## 2014-09-23 ENCOUNTER — Encounter: Payer: Self-pay | Admitting: Family Medicine

## 2014-09-23 ENCOUNTER — Ambulatory Visit (INDEPENDENT_AMBULATORY_CARE_PROVIDER_SITE_OTHER): Payer: Medicare Other | Admitting: Family Medicine

## 2014-09-23 VITALS — BP 130/88 | HR 76 | Temp 98.0°F | Wt 294.8 lb

## 2014-09-23 DIAGNOSIS — M545 Low back pain, unspecified: Secondary | ICD-10-CM

## 2014-09-23 DIAGNOSIS — Z23 Encounter for immunization: Secondary | ICD-10-CM

## 2014-09-23 DIAGNOSIS — E785 Hyperlipidemia, unspecified: Secondary | ICD-10-CM

## 2014-09-23 DIAGNOSIS — G8929 Other chronic pain: Secondary | ICD-10-CM

## 2014-09-23 DIAGNOSIS — L439 Lichen planus, unspecified: Secondary | ICD-10-CM

## 2014-09-23 DIAGNOSIS — F4321 Adjustment disorder with depressed mood: Secondary | ICD-10-CM

## 2014-09-23 MED ORDER — TRAMADOL HCL 50 MG PO TABS: 50.0000 mg | ORAL_TABLET | Freq: Two times a day (BID) | ORAL | Status: AC | PRN

## 2014-09-23 NOTE — Patient Instructions (Addendum)
Flu shot today. If you feel like mood is deteriorating, let us or someone know. For back - xray today. Continue naprosyn - take with food. May start tramadol for pain - don't take and drive. Lower back strengthening exercises provided today. Continue walking and working towards weight loss- best thing for back.  Lichen Planus Lichen planus is a skin problem that causes redness, itching, swelling, and sores. Some common areas affected are the:  Vulva and vagina.  Gums and inside of the mouth.  Esophagus.  Scalp.  Skin of the arms (especially wrists), legs, chest, back, and abdomen.  Fingernails or toenails. CAUSES The cause is not known. It could be an autoimmune illness or an allergy. An autoimmune illness is one where your body attacks itself. Lichen planus is not passed from one person to another (not contagious). It can last for a long time. Affected children usually have a history of other family members with lichen planus. SYMPTOMS  Itching, which can be severe.  The vaginal area may be red, sore, raw, or have a burning feeling.  There may be pain or bleeding during sex.  Scarring may cause the vagina to become short, narrow, or even closed up.  The skin may have small reddish or purplish, flat-topped, round or irregularly shaped bumps.  There may be redness or white patches on the gums or tongue.  The nails may become thin, rough, or have ridges in them.  The eyes can rarely also be involved with pain, redness, or scarring. DIAGNOSIS Your caregiver will look for skin changes, changes inside your mouth, or vaginal discharge. Sometimes, a tissue sample (biopsy) may be sent for testing. TREATMENT  Your caregiver may order a cream (topical steroid) to be put on the sores.  You may be given medicine to take by mouth.  You may be treated by exposure to ultraviolet light.  Sores in the mouth may be treated with lozenges that you suck on like a cough drop.  If the  vagina becomes too tight, you may be taught how to use a dilator to keep it open. HOME CARE INSTRUCTIONS  Only take over-the-counter or prescription medicines as directed by your caregiver.  Keep the vaginal area as clean and dry as possible. SEEK MEDICAL CARE IF: You develop increasing pain, swelling, or redness. Document Released: 02/03/2011 Document Revised: 12/05/2011 Document Reviewed: 02/03/2011 [facility] Patient Information 2015 [place]. This information is not intended to replace advice given to you by your health care provider. Make sure you discuss any questions you have with your health care provider.

## 2014-09-23 NOTE — Assessment & Plan Note (Signed)
S/p derm eval - thought lichen planus vs drug eruption. Provided with pt education handout today. No recent new meds - but will do trial off lipitor for 1 month to see if any improvement in rash. Pt agrees with plan.

## 2014-09-23 NOTE — Assessment & Plan Note (Addendum)
1 mo trial off lipitor -if rash unchanged will recommend restart this. See above.

## 2014-09-23 NOTE — Progress Notes (Signed)
BP 130/88 mmHg  Pulse 76  Temp(Src) 98 F (36.7 C) (Oral)  Wt 294 lb 12 oz (133.698 kg)   CC: discuss depression/back pain  Subjective:    Patient ID: Newman NipJose L Vega-Rivera, male    DOB: [birth year], 53 y.o.   MRN: [id number]  HPI: Newman NipJose L Vega-Rivera is a 53 y.o. male presenting on 09/23/2014 for Depression and Back Pain   [facility] NP Erskine SquibbJane concerned with depression on yearly home visit with Mr Cena BentonVega [facility](PHQ9 = 20/27). Rough last 2 months. 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 Pt endorses feeling better. Praying more, has friend pastor he can talk to. Persistent trouble sleeping - staying asleep. Appetite ok. No anhedonia. Energy level ok, concentration decreased. SI last month, but not anymore. More praying.   Also would like to discuss worsening back pain rated at 7/10. Worsening over last 3-4 weeks. L lumbar radiculopathy. + weakness and numbness bilaterally. no bowel/bladder accidents, no fevers, no saddle anesthesia. Trouble walking on treadmill at gym 2/2 back pain. Not regular with naprosyn 500mg  bid - not very effective.   Back pain since 1998 after work related accident. On disability for [place] [facility] [place] with [facility].  Lab Results  Component Value Date   TSH 2.960 05/14/2014   Relevant past medical, surgical, family and social history reviewed and updated as indicated. Interim medical history since our last visit reviewed. Allergies and medications reviewed and updated.  Current Outpatient Prescriptions on File Prior to Visit  Medication Sig  . albuterol (PROVENTIL HFA;VENTOLIN HFA) 108 (90 BASE) MCG/ACT inhaler Inhale 2 puffs into the lungs every 6 (six) hours as needed for wheezing.  Marland Kitchen. aspirin 81 MG tablet Take 81 mg by mouth as needed for pain.  Marland Kitchen. atorvastatin (LIPITOR) 20 MG tablet Take 1 tablet (20 mg total) by mouth every other day.  . fluticasone (FLONASE) 50 MCG/ACT nasal spray Place 2 sprays into both nostrils daily.  . naproxen  (NAPROSYN) 500 MG tablet TAKE 1 TABLET BY MOUTH TWICE DAILY FOR 1 WEEK THEN AS NEEDED.TAKE WITH FOOD  . SYNTHROID 125 MCG tablet TAKE 1 TABLET BY MOUTH EVERY DAY   No current facility-administered medications on file prior to visit.   Past Medical History  Diagnosis Date  . Schizoaffective disorder, depressive type     last psychiatrist 2010- worsened after back surgery, Wellbutrin, Prozac, seroquel  . Headache(784.0)   . Dyslipidemia   . Hypothyroidism   . History of nephrolithiasis 2013  . Obesity   . Chronic lower back pain   . History of chicken pox   . History of hepatitis C     resolved per pt    Past Surgical History  Procedure Laterality Date  . Lumbar fusion  2001    with rods  . Abdominal surgery      hernia?  . Colonoscopy  08/2009    hyperplastic polyp, ext hem, ?colitis (bx with focal hemorrhage)    Review of Systems Per HPI unless specifically indicated above     Objective:    BP 130/88 mmHg  Pulse 76  Temp(Src) 98 F (36.7 C) (Oral)  Wt 294 lb 12 oz (133.698 kg)  Wt Readings from Last 3 Encounters:  09/23/14 294 lb 12 oz (133.698 kg)  05/19/14 290 lb 8 oz (131.77 kg)  02/10/14 284 lb 12 oz (129.162 kg)    Physical Exam  Constitutional: He is oriented to person, place, and time. He appears  well-developed and well-nourished. No distress.  obesity  Musculoskeletal: He exhibits no edema.  Mild pain midline spine. No paraspinous mm tenderness + SLR L side Neg FABER No pain at SIJ, GTB or sciatic notch bilaterally.  Neurological: He is alert and oriented to person, place, and time. He has normal strength. No sensory deficit.  5/5 strength BLE  Skin: Rash noted.  patches of hyperkeratotic skin on arms/legs, no oral lesions  Nursing note and vitals reviewed.      Assessment & Plan:   Problem List Items Addressed This Visit    Lichen planus    S/p derm eval - thought lichen planus vs drug eruption. Provided with pt education handout today. No  recent new meds - but will do trial off lipitor for 1 month to see if any improvement in rash. Pt agrees with plan.    Dyslipidemia    1 mo trial off lipitor -if rash unchanged will recommend restart this. See above.    Chronic lower back pain    H/o lumbar fusion 2/2 HNP and pinched nerve per patient 2001.  Worsening lumbar pain over last month - will update lumbar spine films given h/o hardware. Treat pain with tramadol, provided with exercises from [facility] pt advisor on lumbar pain, discussed importance of weight loss and regular walking regimen to help back pain.    Relevant Medications      traMADol (ULTRAM) tablet 50 mg   Other Relevant Orders      DG Lumbar Spine Complete   Adjustment disorder with depressed mood - Primary    Reviewed recent church related stress. Pt feels he is at a better point currently, declines further counseling or pharmacotherapy. Discussed importance of support - he can talk with pastor friend. Discussed importance of stress relieving strategies - encouraged continued exercise at gym. Previously with SI but not currently. Discussed if SI returning needs to seek urgent care - pt agrees        Follow up plan: Return if symptoms worsen or fail to improve.

## 2014-09-23 NOTE — Progress Notes (Signed)
Pre visit review using our clinic review tool, if applicable. No additional management support is needed unless otherwise documented below in the visit note. 

## 2014-09-23 NOTE — Addendum Note (Signed)
Addended by: Josph MachoANCE, KIMBERLY A on: 09/23/2014 11:44 AM   Modules accepted: Orders

## 2014-09-23 NOTE — Assessment & Plan Note (Signed)
H/o lumbar fusion 2/2 HNP and pinched nerve per patient 2001.  Worsening lumbar pain over last month - will update lumbar spine films given h/o hardware. Treat pain with tramadol, provided with exercises from [facility] pt advisor on lumbar pain, discussed importance of weight loss and regular walking regimen to help back pain.

## 2014-09-23 NOTE — Assessment & Plan Note (Signed)
Reviewed recent church related stress. Pt feels he is at a better point currently, declines further counseling or pharmacotherapy. Discussed importance of support - he can talk with pastor friend. Discussed importance of stress relieving strategies - encouraged continued exercise at gym. Previously with SI but not currently. Discussed if SI returning needs to seek urgent care - pt agrees

## 2014-11-19 ENCOUNTER — Ambulatory Visit: Payer: Medicare Other | Admitting: Family Medicine

## 2014-11-21 ENCOUNTER — Ambulatory Visit (INDEPENDENT_AMBULATORY_CARE_PROVIDER_SITE_OTHER): Payer: Medicare Other | Admitting: Family Medicine

## 2014-11-21 ENCOUNTER — Encounter: Payer: Self-pay | Admitting: Family Medicine

## 2014-11-21 VITALS — BP 132/84 | HR 65 | Temp 97.8°F | Wt 294.2 lb

## 2014-11-21 DIAGNOSIS — L439 Lichen planus, unspecified: Secondary | ICD-10-CM

## 2014-11-21 DIAGNOSIS — F4321 Adjustment disorder with depressed mood: Secondary | ICD-10-CM | POA: Diagnosis not present

## 2014-11-21 DIAGNOSIS — E039 Hypothyroidism, unspecified: Secondary | ICD-10-CM

## 2014-11-21 DIAGNOSIS — M545 Low back pain, unspecified: Secondary | ICD-10-CM

## 2014-11-21 DIAGNOSIS — E785 Hyperlipidemia, unspecified: Secondary | ICD-10-CM | POA: Diagnosis not present

## 2014-11-21 DIAGNOSIS — G8929 Other chronic pain: Secondary | ICD-10-CM

## 2014-11-21 NOTE — Assessment & Plan Note (Signed)
Continue brand synthroid.

## 2014-11-21 NOTE — Patient Instructions (Addendum)
You are doing well. Continue walking regularly. Return in 33mo for medicare wellness visit  Lichen Planus Lichen planus is a skin problem that causes redness, itching, swelling, and sores. Some common areas affected are the:  Vulva and vagina.  Gums and inside of the mouth.  Esophagus.  Scalp.  Skin of the arms (especially wrists), legs, chest, back, and abdomen.  Fingernails or toenails. CAUSES The cause is not known. It could be an autoimmune illness or an allergy. An autoimmune illness is one where your body attacks itself. Lichen planus is not passed from one person to another (not contagious). It can last for a long time. Affected children usually have a history of other family members with lichen planus. SYMPTOMS  Itching, which can be severe.  The vaginal area may be red, sore, raw, or have a burning feeling.  There may be pain or bleeding during sex.  Scarring may cause the vagina to become short, narrow, or even closed up.  The skin may have small reddish or purplish, flat-topped, round or irregularly shaped bumps.  There may be redness or white patches on the gums or tongue.  The nails may become thin, rough, or have ridges in them.  The eyes can rarely also be involved with pain, redness, or scarring. DIAGNOSIS Your caregiver will look for skin changes, changes inside your mouth, or vaginal discharge. Sometimes, a tissue sample (biopsy) may be sent for testing. TREATMENT  Your caregiver may order a cream (topical steroid) to be put on the sores.  You may be given medicine to take by mouth.  You may be treated by exposure to ultraviolet light.  Sores in the mouth may be treated with lozenges that you suck on like a cough drop.  If the vagina becomes too tight, you may be taught how to use a dilator to keep it open. HOME CARE INSTRUCTIONS  Only take over-the-counter or prescription medicines as directed by your caregiver.  Keep the vaginal area as clean  and dry as possible. SEEK MEDICAL CARE IF: You develop increasing pain, swelling, or redness. Document Released: 02/03/2011 Document Revised: 12/05/2011 Document Reviewed: 02/03/2011 [facility] Patient Information 2015 [place] [employer], [place]. This information is not intended to replace advice given to you by your health care provider. Make sure you discuss any questions you have with your health care provider.

## 2014-11-21 NOTE — Progress Notes (Signed)
BP 132/84 mmHg  Pulse 65  Temp(Src) 97.8 F (36.6 C) (Oral)  Wt 294 lb 4 oz (133.471 kg)  SpO2 97%   CC: 77mo f/u visit  Subjective:    Patient ID: Franklin Shannon, male    DOB: 1961/04/09, 54 y.o.   MRN: [id number]  HPI: Franklin Shannon is a 54 y.o. male presenting on 11/21/2014 for Follow-up   Last visit we trialed off lipitor for 1 month to see if improvement with lichen planus.  HLD - trial off lipitor without significant improvement in lichen planus so I recommended he restart med.  Hypothyroid - stable on synthroid 125mcg daily (brand)  LBP - doing better. Tramadol helpful. Walking regularly. Improving exercise. Has rejoined [employer] - walking 1 hour daily.  URI at beginning of this month.   Relevant past medical, surgical, family and social history reviewed and updated as indicated. Interim medical history since our last visit reviewed. Allergies and medications reviewed and updated. Current Outpatient Prescriptions on File Prior to Visit  Medication Sig  . albuterol (PROVENTIL HFA;VENTOLIN HFA) 108 (90 BASE) MCG/ACT inhaler Inhale 2 puffs into the lungs every 6 (six) hours as needed for wheezing.  Marland Kitchen. aspirin 81 MG tablet Take 81 mg by mouth as needed for pain.  Marland Kitchen. atorvastatin (LIPITOR) 20 MG tablet Take 1 tablet (20 mg total) by mouth every other day.  . fluticasone (FLONASE) 50 MCG/ACT nasal spray Place 2 sprays into both nostrils daily.  . naproxen (NAPROSYN) 500 MG tablet TAKE 1 TABLET BY MOUTH TWICE DAILY FOR 1 WEEK THEN AS NEEDED.TAKE WITH FOOD  . SYNTHROID 125 MCG tablet TAKE 1 TABLET BY MOUTH EVERY DAY  . traMADol (ULTRAM) 50 MG tablet Take 1 tablet (50 mg total) by mouth 2 (two) times daily as needed for moderate pain.   No current facility-administered medications on file prior to visit.    Review of Systems Per HPI unless specifically indicated above     Objective:    BP 132/84 mmHg  Pulse 65  Temp(Src) 97.8 F (36.6 C) (Oral)  Wt 294  lb 4 oz (133.471 kg)  SpO2 97%  Wt Readings from Last 3 Encounters:  11/21/14 294 lb 4 oz (133.471 kg)  09/23/14 294 lb 12 oz (133.698 kg)  05/19/14 290 lb 8 oz (131.77 kg)    Physical Exam  Constitutional: He appears well-developed and well-nourished. No distress.  HENT:  Mouth/Throat: Oropharynx is clear and moist. No oropharyngeal exudate.  Eyes: Conjunctivae and EOM are normal. Pupils are equal, round, and reactive to light. No scleral icterus.  Neck: Normal range of motion. Neck supple. No thyromegaly present.  Cardiovascular: Normal rate, regular rhythm, normal heart sounds and intact distal pulses.   No murmur heard. Pulmonary/Chest: Effort normal and breath sounds normal. No respiratory distress. He has no wheezes. He has no rales.  Musculoskeletal: He exhibits no edema.  Lymphadenopathy:    He has no cervical adenopathy.  Skin: Skin is warm and dry. Rash noted.  Pink plaques posterior bilateral legs and buttock with excoriations  Psychiatric: He has a normal mood and affect.  Nursing note and vitals reviewed.     Assessment & Plan:   Problem List Items Addressed This Visit    Morbid obesity    Body mass index is 42.22 kg/(m^2). Reviewed weight and congratulated on increased activity.      Lichen planus    Discussed control not cure. Handout provided      Hypothyroidism  Continue brand synthroid.      Dyslipidemia    No improvement in lichen planus off lipitor. Will restart.      Chronic lower back pain    Improved with regular walking, tramadol effective.      Adjustment disorder with depressed mood - Primary    Overall stable.          Follow up plan: Return in about 6 months (around 05/22/2015), or as needed, for medicare wellness.

## 2014-11-21 NOTE — Assessment & Plan Note (Signed)
Body mass index is 42.22 kg/(m^2). Reviewed weight and congratulated on increased activity.

## 2014-11-21 NOTE — Assessment & Plan Note (Signed)
Overall stable.   

## 2014-11-21 NOTE — Assessment & Plan Note (Signed)
Discussed control not cure. Handout provided

## 2014-11-21 NOTE — Assessment & Plan Note (Signed)
No improvement in lichen planus off lipitor. Will restart.

## 2014-11-21 NOTE — Progress Notes (Signed)
Pre visit review using our clinic review tool, if applicable. No additional management support is needed unless otherwise documented below in the visit note. 

## 2014-11-21 NOTE — Assessment & Plan Note (Signed)
Improved with regular walking, tramadol effective.

## 2015-02-17 ENCOUNTER — Other Ambulatory Visit: Payer: Self-pay | Admitting: Family Medicine

## 2015-05-16 ENCOUNTER — Other Ambulatory Visit: Payer: Self-pay | Admitting: Family Medicine

## 2015-05-17 ENCOUNTER — Other Ambulatory Visit: Payer: Self-pay | Admitting: Family Medicine

## 2015-05-17 DIAGNOSIS — E785 Hyperlipidemia, unspecified: Secondary | ICD-10-CM

## 2015-05-17 DIAGNOSIS — Z125 Encounter for screening for malignant neoplasm of prostate: Secondary | ICD-10-CM

## 2015-05-17 DIAGNOSIS — E039 Hypothyroidism, unspecified: Secondary | ICD-10-CM

## 2015-05-17 DIAGNOSIS — Z8619 Personal history of other infectious and parasitic diseases: Secondary | ICD-10-CM

## 2015-05-17 DIAGNOSIS — N289 Disorder of kidney and ureter, unspecified: Secondary | ICD-10-CM

## 2015-05-20 ENCOUNTER — Other Ambulatory Visit: Payer: Medicare Other

## 2015-05-22 ENCOUNTER — Encounter: Payer: Medicare Other | Admitting: Family Medicine

## 2015-07-22 IMAGING — CR DG LUMBAR SPINE COMPLETE 4+V
5 series · 5 of 5 positions shown · non-contrast
Comparison: None.

CLINICAL DATA: Chronic low back pain .

EXAM:
LUMBAR SPINE - COMPLETE 4+ VIEW

[view not recorded (1 of 5)]
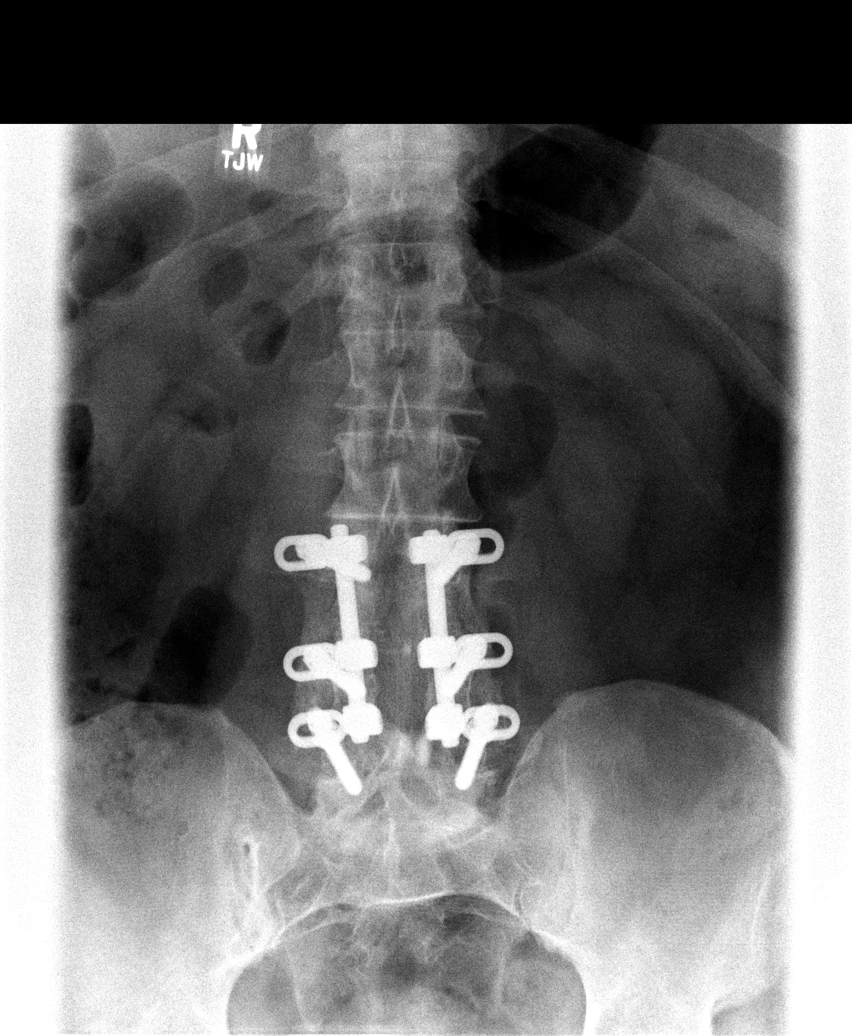

[view not recorded (2 of 5)]
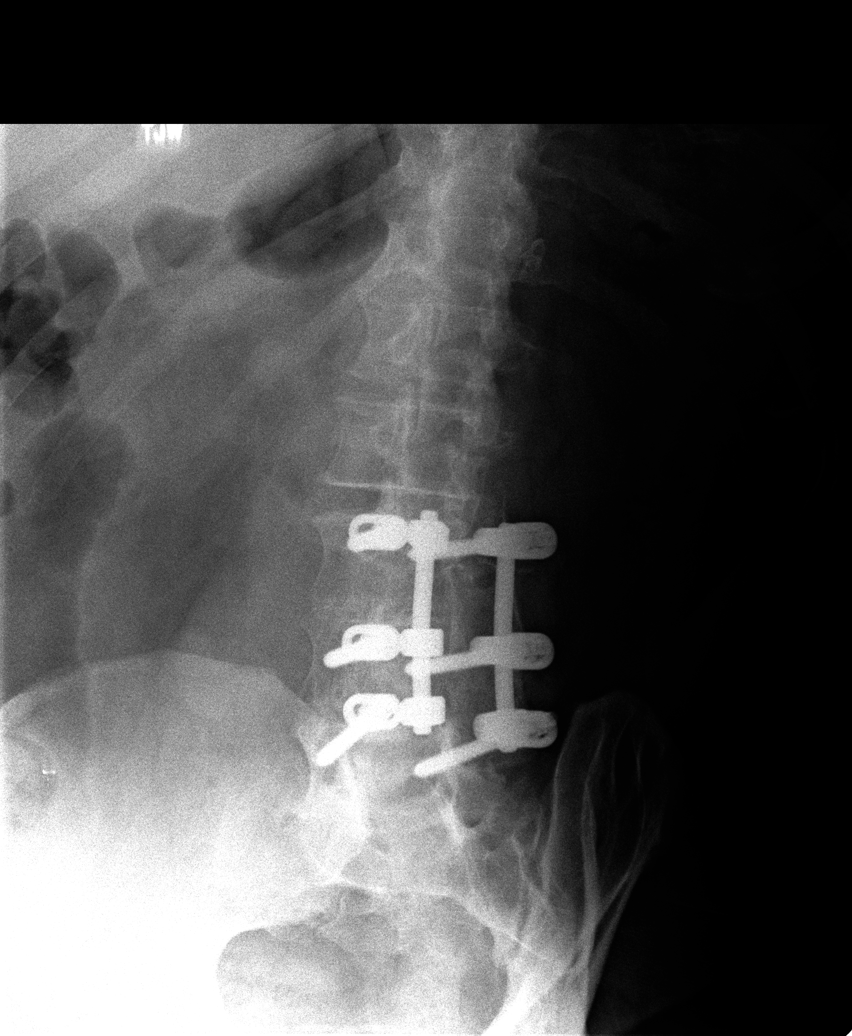

[view not recorded (3 of 5)]
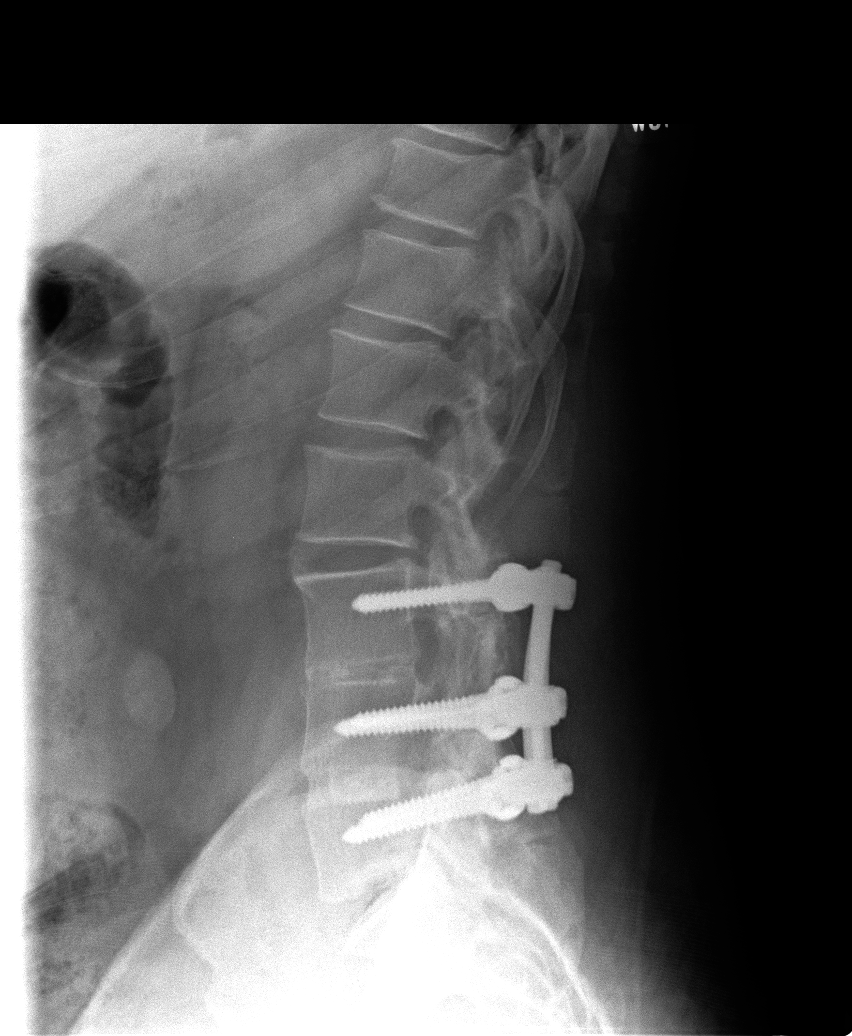

[view not recorded (4 of 5)]
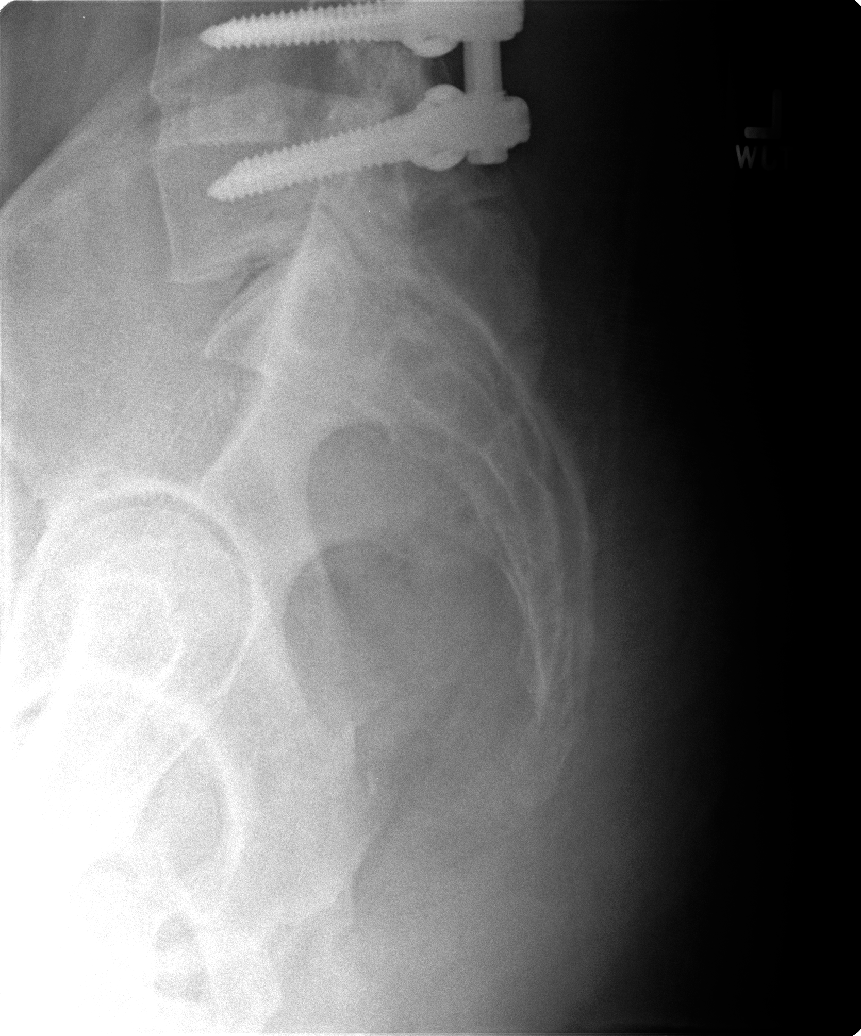

[view not recorded (5 of 5)]
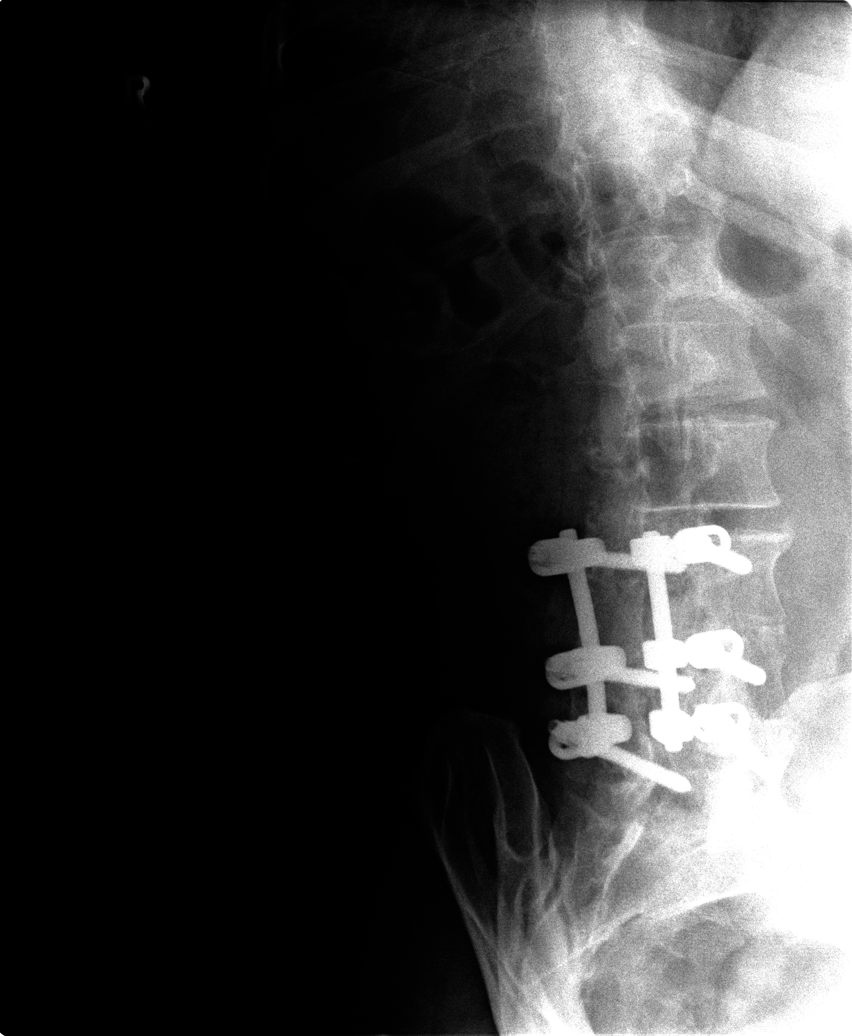

[5 of 5 positions shown; findings below may reference images not displayed]

FINDINGS: Prior L3 through L5 posterior interbody fusion with good anatomic
alignment. Prior lower lumbar laminectomies. No acute bony
abnormality. Diffuse mild osteopenia.
IMPRESSION: Prior L3 through L5 posterior interbody fusion with good anatomic
alignment. Prior lower lumbar laminectomies. No acute abnormality .
